# Patient Record
Sex: Female | Born: 1970 | Race: White | Hispanic: No | Marital: Married | Smoking: Never smoker
Health system: Southern US, Community
[De-identification: ages and names within clinical notes are randomized; demographics above are authoritative.]

## PROBLEM LIST (undated history)

## (undated) DIAGNOSIS — Z9889 Other specified postprocedural states: Secondary | ICD-10-CM

## (undated) DIAGNOSIS — T8859XA Other complications of anesthesia, initial encounter: Secondary | ICD-10-CM

## (undated) DIAGNOSIS — T4145XA Adverse effect of unspecified anesthetic, initial encounter: Secondary | ICD-10-CM

## (undated) DIAGNOSIS — A64 Unspecified sexually transmitted disease: Secondary | ICD-10-CM

## (undated) DIAGNOSIS — R590 Localized enlarged lymph nodes: Principal | ICD-10-CM

## (undated) DIAGNOSIS — R112 Nausea with vomiting, unspecified: Secondary | ICD-10-CM

## (undated) DIAGNOSIS — F419 Anxiety disorder, unspecified: Secondary | ICD-10-CM

## (undated) HISTORY — PX: BREAST SURGERY: SHX581

## (undated) HISTORY — DX: Unspecified sexually transmitted disease: A64

## (undated) HISTORY — DX: Localized enlarged lymph nodes: R59.0

---

## 1998-12-30 ENCOUNTER — Other Ambulatory Visit: Admission: RE | Admit: 1998-12-30 | Discharge: 1998-12-30 | Payer: Self-pay

## 1999-05-22 ENCOUNTER — Other Ambulatory Visit: Admission: RE | Admit: 1999-05-22 | Discharge: 1999-05-22 | Payer: Self-pay | Admitting: Obstetrics and Gynecology

## 2000-05-26 ENCOUNTER — Other Ambulatory Visit: Admission: RE | Admit: 2000-05-26 | Discharge: 2000-05-26 | Payer: Self-pay | Admitting: Obstetrics and Gynecology

## 2000-08-23 ENCOUNTER — Encounter: Payer: Self-pay | Admitting: Neurosurgery

## 2000-08-23 ENCOUNTER — Encounter: Admission: RE | Admit: 2000-08-23 | Discharge: 2000-08-23 | Payer: Self-pay | Admitting: Neurosurgery

## 2001-06-22 ENCOUNTER — Other Ambulatory Visit: Admission: RE | Admit: 2001-06-22 | Discharge: 2001-06-22 | Payer: Self-pay | Admitting: Obstetrics and Gynecology

## 2001-10-17 ENCOUNTER — Encounter (INDEPENDENT_AMBULATORY_CARE_PROVIDER_SITE_OTHER): Payer: Self-pay | Admitting: Specialist

## 2001-10-17 ENCOUNTER — Ambulatory Visit (HOSPITAL_COMMUNITY): Admission: RE | Admit: 2001-10-17 | Discharge: 2001-10-17 | Payer: Self-pay | Admitting: Surgery

## 2002-06-29 ENCOUNTER — Other Ambulatory Visit: Admission: RE | Admit: 2002-06-29 | Discharge: 2002-06-29 | Payer: Self-pay | Admitting: Obstetrics and Gynecology

## 2003-07-12 ENCOUNTER — Other Ambulatory Visit: Admission: RE | Admit: 2003-07-12 | Discharge: 2003-07-12 | Payer: Self-pay | Admitting: Obstetrics and Gynecology

## 2004-12-17 ENCOUNTER — Other Ambulatory Visit: Admission: RE | Admit: 2004-12-17 | Discharge: 2004-12-17 | Payer: Self-pay | Admitting: Obstetrics and Gynecology

## 2006-05-13 ENCOUNTER — Other Ambulatory Visit: Admission: RE | Admit: 2006-05-13 | Discharge: 2006-05-13 | Payer: Self-pay | Admitting: Obstetrics and Gynecology

## 2006-06-07 ENCOUNTER — Inpatient Hospital Stay (HOSPITAL_COMMUNITY): Admission: AD | Admit: 2006-06-07 | Discharge: 2006-06-07 | Payer: Self-pay | Admitting: Obstetrics and Gynecology

## 2006-06-11 ENCOUNTER — Inpatient Hospital Stay (HOSPITAL_COMMUNITY): Admission: AD | Admit: 2006-06-11 | Discharge: 2006-06-15 | Payer: Self-pay | Admitting: Obstetrics and Gynecology

## 2006-12-17 ENCOUNTER — Ambulatory Visit (HOSPITAL_COMMUNITY): Admission: RE | Admit: 2006-12-17 | Discharge: 2006-12-17 | Payer: Self-pay | Admitting: Urology

## 2007-06-22 ENCOUNTER — Encounter: Admission: RE | Admit: 2007-06-22 | Discharge: 2007-06-22 | Payer: Self-pay | Admitting: Obstetrics and Gynecology

## 2007-11-21 ENCOUNTER — Encounter: Admission: RE | Admit: 2007-11-21 | Discharge: 2007-11-21 | Payer: Self-pay | Admitting: Obstetrics and Gynecology

## 2008-12-21 ENCOUNTER — Ambulatory Visit: Payer: Self-pay | Admitting: Family Medicine

## 2008-12-24 ENCOUNTER — Ambulatory Visit: Payer: Self-pay | Admitting: Family Medicine

## 2010-10-19 DIAGNOSIS — A64 Unspecified sexually transmitted disease: Secondary | ICD-10-CM

## 2010-10-19 HISTORY — DX: Unspecified sexually transmitted disease: A64

## 2011-03-06 NOTE — H&P (Signed)
Autumn [name], Autumn [name] NO.:  [id number]   MEDICAL RECORD NO.:  [id number]          PATIENT TYPE:  MAT   LOCATION:  MATC                          FACILITY:  [facility]   PHYSICIAN:  Crist Fat. Rivard, M.D. DATE OF BIRTH:  04-16-1971   DATE OF ADMISSION:  06/11/2006  DATE OF DISCHARGE:                                HISTORY & PHYSICAL   HISTORY OF PRESENT ILLNESS:  This is a 40 year old gravida 1, para 0, at 77-  3/7 weeks who presents with leaking fluid since 1730 Hr. She feels a few  mild contractions and positive fetal movement. Fluid is clear and somewhat  yellow tinged. Pregnancy has been followed by the nurse midwife service and  remarkable for:  1. AMA (declined amniocentesis).  2. Patient is a Publishing rights manager.  3. Questionable micrognathia (not confirmed).  4. Group B strep negative.   ALLERGIES:  None.   OBSTETRIC HISTORY:  The patient is a primigravida.   PAST MEDICAL HISTORY:  1. Remarkable for childhood varicella.  2. History of back injury.   PAST SURGICAL HISTORY:  Remarkable for breast lump removed in 1999 which was  benign.   FAMILY HISTORY:  Remarkable for a grandfather and grandmother with MI.  Mother, father and grandfather with hypertension. Mother with varicosities.  Father with tuberculosis. Grandmother with COPD and diabetes. Sister with  lymphoma. Sister with Hodgkin's. Mother with breast cancer. Grandfather with  non Hodgkin's lymphoma, and a grandmother with breast cancer. Sister with  depression.   GENETIC HISTORY:  Remarkable for an uncle with cleft lip. A cousin with  congenital heart disease.   SOCIAL HISTORY:  The patient is married to Daun Peacock who is involved and  supportive. She works as a Publishing rights manager in urology. She is of the  [place] faith. She denies any alcohol, tobacco or drug use.   PRENATAL LABS:  Hemoglobin 12.8, platelets 224,000. Blood type A positive,  antibody screen negative. RPR nonreactive.  Rubella immune. Hepatitis  negative. HIV negative. Pap test normal. Gonorrhea/Chlamydia declined.  Cystic fibrosis negative.   REVIEW OF SYSTEMS:  The patient describes leaking fluid as above. No other  complaints.   OBSTETRIC COURSE:  The patient entered care at [redacted] weeks gestation. She  declined amniocentesis and first trimester screening but did consent to  nuchal transistency screening which was normal. She had an anatomy  ultrasound which was normal except for questionable micrognathia. She was  offered a consultation for perinatology and declined that. She had another  ultrasound at 22 weeks that noted a facial angle of 42 degrees, normal being  greater than 49 degrees.  She did take [employer]. She had a  Glucola at 26 weeks which was normal. She had another ultrasound at 31 weeks  which was normal. She had a negative group B strep at term.   OBJECTIVE DATA:  VITAL SIGNS:  Stable, afebrile.  HEENT:  Within normal limits. Thyroid not enlarged.  CHEST:  Clear to auscultation.  HEART:  Regular rate and rhythm.  ABDOMEN:  Gravid at 39 cm, vertex, Leopold's maneuver shows reactive fetal  heart rate with irregular  contractions every 7-10 minutes.  PELVIC EXAM:  Vagina shows clear to slightly yellow fluid draining copiously  from vagina. Cervix is 1 cm, 60% effaced, -3 station with vertex engaged.  EXTREMITIES:  Within normal limits.   ASSESSMENT:  1. Intrauterine pregnancy at 40-3/7 weeks.  2. Premature rupture of membranes at term.  3. Prodromal contractions.  4. Group B strep negative.   PLAN:  1. Admit to birthing suite. Dr. Estanislado Pandy notified.  2. Options discussed with patient including expectant management versus      augmentation. Patient prefers expectant management with freedom to move      about as possible.      Marie L. Williams, C.N.M.      Crist Fat Rivard, M.D.  Electronically Signed    MLW/MEDQ  D:  06/11/2006  T:  06/11/2006  Job:   [id number]

## 2011-03-06 NOTE — Discharge Summary (Signed)
[name], [name] NO.:  [id number]   MEDICAL RECORD NO.:  [id number]          PATIENT TYPE:  INP   LOCATION:  9111                          [facility]ILITY:  [facility]   PHYSICIAN:  Osborn Coho, M.D.   DATE OF BIRTH:  01-30-1971   DATE OF ADMISSION:  06/11/2006  DATE OF DISCHARGE:  06/15/2006                                 DISCHARGE SUMMARY   ADMITTING DIAGNOSES:  1. Intrauterine pregnancy at 40-3/7th's weeks.  2. Premature rupture of membranes at term.  3. Group B strep negative.   DISCHARGE DIAGNOSES:  1. Intrauterine pregnancy at 40-3/7th's weeks.  2. Non-reassuring fetal heart rate.  3. Infant with cleft palate and micrognathia.   PROCEDURES:  1. Primary low-transverse cesarean section.  2. Epidural anesthesia.   HOSPITAL COURSE:  Autumn [name] is a 40 year old gravida 1, para 0 at 21-  3/7th's weeks who presented late in the evening of June 11, 2006, with  premature rupture of membranes prior to the onset of labor.  Fluid had a  slight yellow tinge at that time.  She was having just a few contractions.   Her pregnancy had been remarkable for:  1. Advanced maternal age with amnio declined.  2. Patient is a Publishing rights manager.  3. Questionable micrognathia or small chin which was seen on early      ultrasound but then could not be re-evaluated.  The patient also      declined any further evaluation.  4. Group B strep negative.   Hemoglobin, on admission, was 12.8, platelets were 224,000.  On admission  she was 1-cm, 60% effaced with a vertex at a -3 station.  Options reviewed  with the patient.  She elected to maintain expected management initially.  She received some Ambien and did sleep well through the night.  She began to  feel more contractions during the course of the day on June 12, 2006.  She  used breast pumps to try and initiate contractions.  This did increase them  to every 4-5 minutes.  Fetal heart rate remained reassuring.  By 1:15 in  the  afternoon on June 12, 2006, she was 3, 80% vertex, at a -2 to -3 station.  Blood pressure was slightly elevated at 143/91.  Other vital signs were  stable.  PIH labs were done.  She did receive a walking epidural but that  made her very nauseated.  __________ waters were noted.  These were ruptured  at 3:30 in the afternoon with moderate meconium noted.  A continuous  epidural was initiated by 5 p.m.  Contractions were every 5-8 minutes.  The  decision was made to proceed with  Pitocin augmentation.  Scopolamine patch  was eventually used with some benefit.  Antibiotics were deferred per  consult with Dr. Estanislado Pandy, unless the patient presented with a temp.   By 7:15, she had a series of variable decelerations, then a subsequent  prolonged decel.  Scalp lead and intrauterine pressure catheter were placed.  The cervix was 4, 80%, vertex was still at a -2 station and the fetus was  slightly asynclitic.  Pitocin was turned off.  It had been on a level of to  4 milliunits.  Fetal heart rate responded to the usual measures.  Short-term  variability was maintained throughout.  Pitocin was started back at  approximately 7:30, it had to be discontinued again at 8:04 p.m. for another  prolonged deceleration.  Again, variability was maintained throughout,  however, there had been rapid progress to 8-to-9-cm.  There was more cervix  on the right than the left.  __________ maintained themselves at less than  170 after Pitocin was discontinued.  Pitocin was restarted at 8:50 at 2  milliunits/min.  There was another decel subsequent to that and a C-section  was recommended at that time.  The cervix was still 8-cm, 80% with a vertex,  at a -1 station.  Dr. Estanislado Pandy was notified and a cesarean was performed by  Dr. Estanislado Pandy under existing epidural anesthesia.  Findings were a viable female  by the name of [name], born at 10:29.  Apgar's were 9 and 9.  Weight was 6  pounds 9 ounces.  There was micrognathia  noted and a cleft palate was also  noted.  The infant was taken to the full term nursery.  Mother was taken to  recovery in good condition.  Breast feeding and pumping was initiated the  following day.  Hemoglobin on day 1 was 10.0, hematocrit 28.7, white blood  cell count 14.9, and platelet count was 175.  The rest of her postoperative  care was within normal limits.  Infant did do a good job latching on with  using breast shields.  The patient was planning condoms for birth control.   By post-op day 3, vital signs were stable.  The patient was afebrile.  She  was tolerating a regular diet.  She was up ad lib and the infant was able to  be discharged home with her.  Incision was clean, dry and intact with Steri-  Strips and subcuticular stitches noted.   The patient was deemed to have received full benefit of hospital stay and  was discharged home.   DISCHARGE INSTRUCTIONS:  Per [employer].   DISCHARGE MEDICATIONS:  1. Motrin 600 mg p.o. q.6 h. p.r.n. pain.  2. Tylox 1-2 p.o. every three to four hours p.r.n. pain.   DISCHARGE FOLLOWUP:  Will occur in 3 weeks at [facility] with me  secondary to an increased risk of postpartum depression and a special needs  infant.      Renaldo Reel Emilee Hero, C.N.M.      Osborn Coho, M.D.  Electronically Signed    VLL/MEDQ  D:  06/15/2006  T:  06/15/2006  Job:  [id number]

## 2011-03-06 NOTE — Op Note (Signed)
NAMESCARLETT, PORTLOCK NO.:  [id number]   MEDICAL RECORD NO.:  [id number]          PATIENT TYPE:  INP   LOCATION:  9111                          [facility]ILITY:  [facility]   PHYSICIAN:  Crist Fat. Rivard, M.D. DATE OF BIRTH:  [birth year]   DATE OF PROCEDURE:  06/12/2006  DATE OF DISCHARGE:                                 OPERATIVE REPORT   PREOPERATIVE DIAGNOSIS:  Intrauterine pregnancy at 40 weeks and 3 days, with  nonreassuring fetal heart rate.   POSTOPERATIVE DIAGNOSIS:  Intrauterine pregnancy at 40 weeks and 3 days,  with nonreassuring fetal heart rate.   PROCEDURE:  Primary low transverse cesarean section.   SURGEON:  Crist Fat. Rivard, M.D.   ASSISTANTRenaldo Reel. Latham, C.N.M.   ESTIMATED BLOOD LOSS:  700 cc.   PROCEDURE:  After being informed of the planned procedure, with possible  complications including bleeding, infection, injury to bowel, bladder, or  ureters, informed consent is obtained. The patient is taken to O.R. 2, and  preexisting epidural anesthesia is reinforced.  The patient is placed in the  dorsal decubitus position, pelvis tilted to the left, prepped and draped in  a sterile fashion, and a Foley catheter was already in her bladder. Prior to  prepping the patient, fetal heart rate had returned to normal, with a  baseline of 130-140 and no deceleration.  Scalp lead is then removed, and  the patient is draped.   After assessing adequate level of anesthesia, we infiltrate the suprapubic  area with 20 cc of Marcaine 0.25 and perform a Pfannenstiel incision which  is brought down sharply to the fascia.  Fascia is then incised in a low  transverse fashion.  Linea alba is dissected.  Peritoneum is entered  sharply.  Visceral peritoneum is entered in a low transverse fashion,  allowing [place] to safely retract bladder by developing a bladder flap.  Myometrium is entered in a low transverse fashion, first with knife then  extended bluntly.  Amniotic fluid  is meconium stained. We assist the birth  of a female infant in left OA presentation.  Mouth and nose are suctioned with  DeLee suction body was delivered.  Cord is clamped with two Kelly clamps and  sectioned, and the baby is given to Dr. Francine Graven, neonatologist present in  the room.  8 cc of blood is drawn from the umbilical vein, and the cord is  clamped for cord blood donation. The placenta is allowed to deliver  spontaneously.  It is complete, cord has three vessels, and the uterine  revision is negative.  Ancef 2 g IV is given to the patient, and Pitocin  perfusion is started.   Myometrium is closed in two layers, first with a running lock suture of 0  Vicryl, then with a Lembert suture of zero Vicryl imbricating the first one.  Hemostasis is checked and adequate.  Both paracolic gutters are cleaned,  pelvis was irrigated profusely with warm saline, and hemostasis is rechecked  and adequate.  Both tubes and ovaries are assessed and normal.   Under fascia, hemostasis is completed with cautery, and the fascia is  closed  with two running sutures of 1 Vicryl, meeting midline.  The wound is then  irrigated with warm saline.  Hemostasis is completed with cautery, and the  skin is closed with a subcuticular suture of 3-0 Monocryl and Steri-Strips.   Instruments and sponge count is complete x2.  Estimated blood loss is 700  cc. The procedure is very well tolerated by the patient, who is taken to the  recovery room in a well and stable condition.   Little boy named [name] was born at 10:29 p.m., received an Apgar of 9 at one  minute and 9 and five minutes, and weight 6 pounds 9 ounces.  With initial  evaluation, [name] was found to have a cleft palate with intact lip, as well  as micrognathia.      Crist Fat Rivard, M.D.  Electronically Signed     SAR/MEDQ  D:  06/12/2006  T:  06/14/2006  Job:  [id number]

## 2011-03-06 NOTE — Op Note (Signed)
[facility]  Patient:    Autumn [name], Autumn [name] Visit Number: [id number] MRN: [id number]          Service Type: DSU Location: DAY Attending Physician:  Katha Cabal Proc. Date: 10/17/01 Admit Date:  10/17/2001   CC:         Jeralyn Ruths, M.D.                           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  It has been more palpable to her and has been worrying her, and desired it to be removed.  PROCEDURE PERFORMED:  Right breast biopsy.  SURGEON:  Thornton Park. Daphine Deutscher, M.D.  ANESTHESIA:  MAC.  DESCRIPTION OF PROCEDURE:  The patient was taken to room #1 at [facility] and the area in question had been localized by her and marked.  The breast was prepped with Betadine and draped sterilely.  Skin lines were marked and an appropriate incision was planned with a perpendicular line to subsequently line up the incision were made.  The area was infiltrated with lidocaine and an incision was made and carried down through the dermis into the fat.  I carried this down through the fat, could palpate the mass, and then put a suture through it, and used that to pull it up.  Sharp dissection using the Metzenbaums was done to get around the mass and excise it in toto.  Minimal bleeding was encountered and this was controlled with the electrocautery. When out, the specimen was opened on the table, and I could see the white well defined nodule consistent with a fibroadenoma.  This was confirmed with anatomic identification by Dr. Laureen Ochs and sent for permanent sections.  In the meantime, the wound was irrigated with saline and with residual lidocaine and Sensorcaine mixture.  The deep breast tissue was approximated with 4-0 Vicryl.  The subcutaneous tissue was approximated with 4-0 Vicryl and the skin  wound was closed with a running subcuticular 5-0 Vicryl with Benzoin and Steri-Strips.  The patient tolerated the procedure well.  She was taken to the recovery room in satisfactory condition.  She was given Vicodin to take for pain and should be followed in the office in approximately three weeks. Attending Physician:  Katha Cabal DD:  10/17/01 TD:  10/17/01 Job: [id number]

## 2011-04-16 ENCOUNTER — Other Ambulatory Visit: Payer: Self-pay | Admitting: Obstetrics and Gynecology

## 2011-04-16 DIAGNOSIS — Z1231 Encounter for screening mammogram for malignant neoplasm of breast: Secondary | ICD-10-CM

## 2011-04-28 ENCOUNTER — Ambulatory Visit: Payer: Self-pay

## 2011-05-20 ENCOUNTER — Ambulatory Visit
Admission: RE | Admit: 2011-05-20 | Discharge: 2011-05-20 | Disposition: A | Discharge status: Home / Self Care | Payer: 59 | Source: Ambulatory Visit | Attending: Obstetrics and Gynecology | Admitting: Obstetrics and Gynecology

## 2011-05-20 DIAGNOSIS — Z1231 Encounter for screening mammogram for malignant neoplasm of breast: Secondary | ICD-10-CM

## 2011-11-02 ENCOUNTER — Other Ambulatory Visit: Payer: Self-pay | Admitting: Otolaryngology

## 2011-11-10 ENCOUNTER — Ambulatory Visit
Admission: RE | Admit: 2011-11-10 | Discharge: 2011-11-10 | Disposition: A | Discharge status: Home / Self Care | Payer: 59 | Source: Ambulatory Visit | Attending: Otolaryngology | Admitting: Otolaryngology

## 2011-11-10 MED ORDER — GADOBENATE DIMEGLUMINE 529 MG/ML IV SOLN
13.0000 mL | Freq: Once | INTRAVENOUS | Status: AC | PRN
  Administered 2011-11-10: 13 mL via INTRAVENOUS

## 2012-07-05 ENCOUNTER — Ambulatory Visit (INDEPENDENT_AMBULATORY_CARE_PROVIDER_SITE_OTHER): Payer: 59 | Admitting: Obstetrics and Gynecology

## 2012-07-05 ENCOUNTER — Encounter: Payer: Self-pay | Admitting: Obstetrics and Gynecology

## 2012-07-05 VITALS — Wt 143.0 lb

## 2012-07-05 DIAGNOSIS — N6019 Diffuse cystic mastopathy of unspecified breast: Secondary | ICD-10-CM

## 2012-07-05 DIAGNOSIS — N943 Premenstrual tension syndrome: Secondary | ICD-10-CM

## 2012-07-05 DIAGNOSIS — D249 Benign neoplasm of unspecified breast: Secondary | ICD-10-CM

## 2012-07-05 DIAGNOSIS — Z124 Encounter for screening for malignant neoplasm of cervix: Secondary | ICD-10-CM

## 2012-07-05 DIAGNOSIS — D241 Benign neoplasm of right breast: Secondary | ICD-10-CM | POA: Insufficient documentation

## 2012-07-05 DIAGNOSIS — C8190 Hodgkin lymphoma, unspecified, unspecified site: Secondary | ICD-10-CM

## 2012-07-05 DIAGNOSIS — Z0000 Encounter for general adult medical examination without abnormal findings: Secondary | ICD-10-CM

## 2012-07-05 DIAGNOSIS — F3281 Premenstrual dysphoric disorder: Secondary | ICD-10-CM | POA: Insufficient documentation

## 2012-07-05 NOTE — Progress Notes (Signed)
Regular Periods: yes Mammogram: no  Monthly Breast Ex.: no Exercise: yes  Tetanus < 10 years: no  Seatbelts: yes  NI. Bladder Functn.: yes Abuse at home: no  Daily BM's: yes Stressful Work: no  Healthy Diet: yes Sigmoid-Colonoscopy: 2008 WNL  Calcium: no Medical problems this year: R breast lump   LAST PAP:05/19/2011  Contraception: none  Mammogram:  1 year ago TIME!  PCP: [facility] Autumn [name]   PMH: see hx  FMH: see hx  Last Bone Scan: none  No other issues per pt.

## 2012-07-05 NOTE — Progress Notes (Signed)
Subjective:    Autumn [name] is a 41 y.o. female, G2P0011, who presents for an annual exam.   Patient reports:  Severe mood swings just before cycle onset, lasting just isolated time on one day, but severe.  Had spoken with me informally about this, then saw a counselor.  Started Zoloft, currently only taking 12.5 q day just prior to cycle onset, due to SE on 50 and 25 mg dosing.  Feels 12.5 mg gives benefit without SE.  Notes increased fibrous changes in breast. 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 One biological son, one adopted son.    History   Social History  . Marital Status: Married    Spouse Name: N/A    Number of Children: N/A  . Years of Education: N/A   Social History Main Topics  . Smoking status: Never Smoker   . Smokeless tobacco: Never Used  . Alcohol Use: No  . Drug Use: No  . Sexually Active: Yes -- Female partner(s)    Birth Control/ Protection: Condom   Other Topics Concern  . None   Social History Narrative  . None    Menstrual cycle:   LMP: Patient's last menstrual period was 06/26/2012.           Cycle: WNL  The following portions of the patient's history were reviewed and updated as appropriate: allergies, current medications, past family history, past medical history, past social history, past surgical history and problem list.  Review of Systems Pertinent items are noted in HPI. Breast:Negative for breast lump,nipple discharge or nipple retraction Gastrointestinal: Negative for abdominal pain, change in bowel habits or rectal bleeding Urinary:negative   Objective:    Wt 143 lb (64.864 kg)  LMP 06/26/2012    Weight:  Wt Readings from Last 1 Encounters:  07/05/12 143 lb (64.864 kg)          BMI: There is no height on file to calculate BMI.  General Appearance: Alert, appropriate appearance for age. No acute distress HEENT: Grossly normal Neck / Thyroid: Supple, no masses, nodes or enlargement Lungs: clear to auscultation  bilaterally Back: No CVA tenderness Breast Exam: Breasts are bilaterally fibrous, with no obvious dominant masses or lumps. Cardiovascular: Regular rate and rhythm. S1, S2, no murmur Gastrointestinal: Soft, non-tender, no masses or organomegaly Pelvic Exam: Vulva and vagina appear normal. Bimanual exam reveals normal uterus and adnexa. Rectovaginal: normal rectal, no masses Lymphatic Exam: Non-palpable nodes in neck, clavicular, axillary, or inguinal regions Skin: no rash or abnormalities Neurologic: Normal gait and speech, no tremor  Psychiatric: Alert and oriented, appropriate affect.   Wet Prep:not applicable Urinalysis:not applicable UPT: Not done   Assessment:    Normal gyn exam  PMDD vs. Severe PMS   Plan:    Mammogram: Due--patient will self[facility] with [facility] Pap:  Done STD screening: declined Contraception:condoms Other:  Continue f/u with counselor and PA for Zoloft med management. TSH, CBC, CMP, Vit D, fasting lipid profile next week as lab only appointment--dx fatigue and family hx hypercholesterinemia.      Nyra Capes, [place]

## 2012-07-12 ENCOUNTER — Other Ambulatory Visit: Payer: 59

## 2012-07-12 DIAGNOSIS — Z0000 Encounter for general adult medical examination without abnormal findings: Secondary | ICD-10-CM

## 2012-07-12 DIAGNOSIS — R5381 Other malaise: Secondary | ICD-10-CM

## 2012-07-12 DIAGNOSIS — R5383 Other fatigue: Secondary | ICD-10-CM

## 2012-07-12 LAB — CBC
MCH: 30 pg (ref 26.0–34.0)
MCHC: 34.7 g/dL (ref 30.0–36.0)
Platelets: 189 10*3/uL (ref 150–400)
RBC: 4.16 MIL/uL (ref 3.87–5.11)

## 2012-07-12 LAB — COMPREHENSIVE METABOLIC PANEL
ALT: 11 U/L (ref 0–35)
Albumin: 4.6 g/dL (ref 3.5–5.2)
CO2: 27 mEq/L (ref 19–32)
Calcium: 9.1 mg/dL (ref 8.4–10.5)
Chloride: 105 mEq/L (ref 96–112)
Sodium: 139 mEq/L (ref 135–145)
Total Protein: 6.5 g/dL (ref 6.0–8.3)

## 2012-07-12 LAB — TSH: TSH: 2.038 u[IU]/mL (ref 0.350–4.500)

## 2012-07-12 LAB — LIPID PANEL: Cholesterol: 172 mg/dL (ref 0–200)

## 2012-07-13 LAB — VITAMIN D 25 HYDROXY (VIT D DEFICIENCY, FRACTURES): Vit D, 25-Hydroxy: 33 ng/mL (ref 30–89)

## 2012-07-14 ENCOUNTER — Telehealth: Payer: Self-pay

## 2012-07-14 NOTE — Telephone Encounter (Signed)
LM ON VM INFORMING PT OF TEST RESULTS. LET PT KNOW BY VM THAT HER TEST RESULT WERE ALL WNL EXCEPT FOR HER SLIGHTLY ELEVATED LDL WHICH SHE CAN ADJUST HER DIET TO GET THAT TO NORMAL LIMITS. INFORMED PT THAT IF SHE HAS ANY QUESTIONS TO GIVE [place] A CALL BACK.

## 2012-07-15 ENCOUNTER — Other Ambulatory Visit: Payer: Self-pay | Admitting: Obstetrics and Gynecology

## 2012-07-15 ENCOUNTER — Telehealth: Payer: Self-pay | Admitting: Obstetrics and Gynecology

## 2012-07-15 DIAGNOSIS — N6019 Diffuse cystic mastopathy of unspecified breast: Secondary | ICD-10-CM

## 2012-07-15 NOTE — Telephone Encounter (Signed)
Tc to pt regarding msg.  Per VL, may put in order for diagnostic bilateral mammogram to the [facility].  Pt also says saw in her chart something positive for an STD, informed pt did not see anything in the system or in her chart, pt voices agreement to all.

## 2012-08-04 ENCOUNTER — Ambulatory Visit
Admission: RE | Admit: 2012-08-04 | Discharge: 2012-08-04 | Disposition: A | Discharge status: Home / Self Care | Payer: 59 | Source: Ambulatory Visit | Attending: Obstetrics and Gynecology | Admitting: Obstetrics and Gynecology

## 2012-08-04 ENCOUNTER — Other Ambulatory Visit: Payer: Self-pay | Admitting: Obstetrics and Gynecology

## 2012-08-04 ENCOUNTER — Encounter: Payer: Self-pay | Admitting: Obstetrics and Gynecology

## 2012-08-04 DIAGNOSIS — N6019 Diffuse cystic mastopathy of unspecified breast: Secondary | ICD-10-CM

## 2012-10-07 ENCOUNTER — Ambulatory Visit (INDEPENDENT_AMBULATORY_CARE_PROVIDER_SITE_OTHER): Payer: 59 | Admitting: Emergency Medicine

## 2012-10-07 VITALS — BP 91/65 | HR 106 | Temp 98.4°F | Resp 17 | Ht 66.0 in | Wt 145.0 lb

## 2012-10-07 DIAGNOSIS — R509 Fever, unspecified: Secondary | ICD-10-CM

## 2012-10-07 DIAGNOSIS — J111 Influenza due to unidentified influenza virus with other respiratory manifestations: Secondary | ICD-10-CM

## 2012-10-07 LAB — POCT INFLUENZA A/B: Influenza B, POC: NEGATIVE

## 2012-10-07 MED ORDER — HYDROCOD POLST-CHLORPHEN POLST 10-8 MG/5ML PO LQCR: 5.0000 mL | Freq: Two times a day (BID) | ORAL | Status: DC | PRN

## 2012-10-07 MED ORDER — PSEUDOEPHEDRINE-GUAIFENESIN ER 60-600 MG PO TB12: 1.0000 | ORAL_TABLET | Freq: Two times a day (BID) | ORAL | Status: AC

## 2012-10-07 NOTE — Progress Notes (Signed)
[facility] [address] [place] [address] [phone]  Date:  10/07/2012   Name:  Autumn [name]   DOB:  [birth year]   MRN:  [id number]  PCP:  Claude Manges, FNP    Chief Complaint: Cough, Generalized Body Aches and Fever   History of Present Illness:  Autumn [name] is a 41 y.o. very pleasant female patient who presents with the following:  Ill since Tuesday night with fever to 102 and cough.  Has malaise, myalgia, chills.  Cough not productive with no wheezing or shortness of breath.  Today developed a clear nasal drainage. No headache, nausea or vomiting.  No improvement with OTC meds.  Had a flu shot.  Works as a Engineer, civil (consulting).  Patient Active Problem List  Diagnosis  . Fibrocystic breast changes  . PMDD (premenstrual dysphoric disorder)  . Hx of fibroadenoma of right breast  . Family hx of Hodgkin's disease    Past Medical History  Diagnosis Date  . Venereal disease 2012    ringing in L ear     Past Surgical History  Procedure Date  . Cesarean section 2007  . Breast surgery 1999 or 2000    R breast fibercystic    History  Substance Use Topics  . Smoking status: Never Smoker   . Smokeless tobacco: Never Used  . Alcohol Use: No    Family History  Problem Relation Age of Onset  . Hypertension Mother   . Hypertension Father   . Heart disease Father   . Hodgkin's lymphoma Sister   . Heart disease Sister   . Diabetes Maternal Grandmother   . Cancer Maternal Grandmother     breast  . Cancer Maternal Grandfather     lymphoma  . Cancer Paternal Grandmother     breast  . Hypertension Paternal Grandfather   . Heart disease Paternal Grandfather     No Known Allergies  Medication list has been reviewed and updated.  Current Outpatient Prescriptions on File Prior to Visit  Medication Sig Dispense Refill  . sertraline (ZOLOFT) 50 MG tablet Take 50 mg by mouth daily.        Review of Systems:  As per HPI, otherwise  negative.    Physical Examination: Filed Vitals:   10/07/12 1157  BP: 91/65  Pulse: 106  Temp: 98.4 F (36.9 C)  Resp: 17   Filed Vitals:   10/07/12 1157  Height: 5\' 6"  (1.676 m)  Weight: 145 lb (65.772 kg)   Body mass index is 23.40 kg/(m^2). Ideal Body Weight: Weight in (lb) to have BMI = 25: 154.6   GEN: WDWN, NAD, Non-toxic, A & O x 3 HEENT: Atraumatic, Normocephalic. Neck supple. No masses, No LAD. Ears and Nose: No external deformity. CV: RRR, No M/G/R. No JVD. No thrill. No extra heart sounds. PULM: CTA B, no wheezes, crackles, rhonchi. No retractions. No resp. distress. No accessory muscle use. ABD: S, NT, ND, +BS. No rebound. No HSM. EXTR: No c/c/e NEURO Normal gait.  PSYCH: Normally interactive. Conversant. Not depressed or anxious appearing.  Calm demeanor.    Assessment and Plan: Influenza Outside window for tamiflu mucinex d tussionex  Carmelina Dane, MD  Results for orders placed in visit on 10/07/12  POCT INFLUENZA A/B      Component Value Range   Influenza A, POC Positive     Influenza B, POC

## 2012-10-14 ENCOUNTER — Other Ambulatory Visit: Payer: Self-pay | Admitting: Family Medicine

## 2012-10-14 ENCOUNTER — Ambulatory Visit
Admission: RE | Admit: 2012-10-14 | Discharge: 2012-10-14 | Disposition: A | Discharge status: Home / Self Care | Payer: 59 | Source: Ambulatory Visit | Attending: Family Medicine | Admitting: Family Medicine

## 2012-10-14 DIAGNOSIS — R05 Cough: Secondary | ICD-10-CM

## 2012-10-14 NOTE — Progress Notes (Signed)
Reviewed and agree.

## 2012-12-03 ENCOUNTER — Other Ambulatory Visit: Payer: Self-pay

## 2013-08-24 ENCOUNTER — Other Ambulatory Visit: Payer: Self-pay

## 2013-09-19 ENCOUNTER — Other Ambulatory Visit: Payer: Self-pay

## 2013-09-19 DIAGNOSIS — Z1231 Encounter for screening mammogram for malignant neoplasm of breast: Secondary | ICD-10-CM

## 2013-09-20 ENCOUNTER — Ambulatory Visit
Admission: RE | Admit: 2013-09-20 | Discharge: 2013-09-20 | Disposition: A | Discharge status: Home / Self Care | Payer: 59 | Source: Ambulatory Visit

## 2013-09-20 DIAGNOSIS — Z1231 Encounter for screening mammogram for malignant neoplasm of breast: Secondary | ICD-10-CM

## 2015-02-04 ENCOUNTER — Other Ambulatory Visit: Payer: Self-pay

## 2015-02-04 DIAGNOSIS — Z1231 Encounter for screening mammogram for malignant neoplasm of breast: Secondary | ICD-10-CM

## 2015-03-01 ENCOUNTER — Ambulatory Visit: Payer: Self-pay

## 2015-04-19 ENCOUNTER — Ambulatory Visit: Payer: Self-pay

## 2015-04-19 ENCOUNTER — Telehealth: Payer: Self-pay | Admitting: Hematology and Oncology

## 2015-04-19 NOTE — Telephone Encounter (Signed)
new patient appt-s/w patient and gave np appt for 07/15 @ 1:30 w/Dr. Alvy Bimler

## 2015-05-03 ENCOUNTER — Ambulatory Visit: Payer: 59

## 2015-05-03 ENCOUNTER — Encounter: Payer: Self-pay | Admitting: Hematology and Oncology

## 2015-05-03 ENCOUNTER — Telehealth: Payer: Self-pay | Admitting: Hematology and Oncology

## 2015-05-03 ENCOUNTER — Ambulatory Visit (HOSPITAL_BASED_OUTPATIENT_CLINIC_OR_DEPARTMENT_OTHER): Payer: 59

## 2015-05-03 ENCOUNTER — Ambulatory Visit (HOSPITAL_BASED_OUTPATIENT_CLINIC_OR_DEPARTMENT_OTHER): Payer: 59 | Admitting: Hematology and Oncology

## 2015-05-03 VITALS — BP 130/75 | HR 69 | Temp 98.2°F | Resp 18 | Ht 66.0 in | Wt 150.8 lb

## 2015-05-03 DIAGNOSIS — R599 Enlarged lymph nodes, unspecified: Secondary | ICD-10-CM | POA: Diagnosis not present

## 2015-05-03 DIAGNOSIS — R590 Localized enlarged lymph nodes: Secondary | ICD-10-CM

## 2015-05-03 HISTORY — DX: Localized enlarged lymph nodes: R59.0

## 2015-05-03 LAB — CBC WITH DIFFERENTIAL/PLATELET
BASO%: 0.2 % (ref 0.0–2.0)
BASOS ABS: 0 10*3/uL (ref 0.0–0.1)
EOS ABS: 0.1 10*3/uL (ref 0.0–0.5)
EOS%: 1.1 % (ref 0.0–7.0)
HEMATOCRIT: 35.3 % (ref 34.8–46.6)
HGB: 12.2 g/dL (ref 11.6–15.9)
LYMPH%: 23.4 % (ref 14.0–49.7)
MCH: 29.5 pg (ref 25.1–34.0)
MCHC: 34.6 g/dL (ref 31.5–36.0)
MCV: 85.5 fL (ref 79.5–101.0)
MONO#: 0.5 10*3/uL (ref 0.1–0.9)
MONO%: 9.9 % (ref 0.0–14.0)
NEUT%: 65.4 % (ref 38.4–76.8)
NEUTROS ABS: 3.6 10*3/uL (ref 1.5–6.5)
Platelets: 156 10*3/uL (ref 145–400)
RBC: 4.13 10*6/uL (ref 3.70–5.45)
RDW: 12.9 % (ref 11.2–14.5)
WBC: 5.4 10*3/uL (ref 3.9–10.3)
lymph#: 1.3 10*3/uL (ref 0.9–3.3)

## 2015-05-03 LAB — LACTATE DEHYDROGENASE (CC13): LDH: 164 U/L (ref 125–245)

## 2015-05-03 LAB — MORPHOLOGY
PLATELET MORPHOLOGY: NORMAL
PLT EST: ADEQUATE
RBC Comments: NORMAL

## 2015-05-03 NOTE — Assessment & Plan Note (Signed)
She has strong family history of lymphoma and understandably is concerned she may also have lymphoma. On clinical exam, she had palpable bilateral lymphadenopathy which are very small. They could also be reactive in nature. I will order blood work to exclude infection, autoimmune disease and CT scan for further assessment. If blood work is unrevealing, she may benefit from excisional lymph node biopsy to exclude lymphoma.

## 2015-05-03 NOTE — Telephone Encounter (Signed)
Pt confirmed labs/ov per 07/15 POF, gave pt AVS and Calendar.Cherylann Banas, sent msg to add chemo NP.Marland KitchenMarland KitchenMarland Kitchen

## 2015-05-03 NOTE — Progress Notes (Signed)
Checked in new pt with no financial concerns prior to seeing the dr.  Pt has my card for any billing questions, concerns or if financial assistance is needed.  ° °

## 2015-05-03 NOTE — Progress Notes (Signed)
[facility] NOTE  Patient Care Team: Helane Rima, MD as PCP - General (Family Medicine) Thornell Sartorius, MD as Consulting Physician (Otolaryngology) Vicie Mutters, MD as Consulting Physician (Otolaryngology)  CHIEF COMPLAINTS/PURPOSE OF CONSULTATION:  Bilateral lymphadenopathy, concerning for lymphoma  HISTORY OF PRESENTING ILLNESS:  Autumn [name] 44 y.o. female is here because of bilateral palpable lymphadenopathy in the neck region. This patient has strong family history of lymphoma in her sister. Over the past month or so, she self palpated enlarging lymphadenopathy in the left side of the neck. She denies any tenderness. Denies any recent infection in the head and neck region. She thought she might have felt a lymph node in the right groin as well. She denies any night sweats, loss of appetite or weight.   MEDICAL HISTORY:  Past Medical History  Diagnosis Date  . Venereal disease 2012    ringing in L ear   . Lymphadenopathy of head and neck region 05/03/2015    SURGICAL HISTORY: Past Surgical History  Procedure Laterality Date  . Cesarean section  2007  . Breast surgery  1999 or 2000    R breast fibercystic    SOCIAL HISTORY: History   Social History  . Marital Status: Married    Spouse Name: N/A  . Number of Children: N/A  . Years of Education: N/A   Occupational History  . Not on file.   Social History Main Topics  . Smoking status: Never Smoker   . Smokeless tobacco: Never Used  . Alcohol Use: No  . Drug Use: No  . Sexual Activity:    Partners: Male    Birth Control/ Protection: Condom   Other Topics Concern  . Not on file   Social History Narrative    FAMILY HISTORY: Family History  Problem Relation Age of Onset  . Hypertension Mother   . Cancer Mother 85    uterine ca  . Hypertension Father   . Heart disease Father   . Hodgkin's lymphoma Sister   . Heart disease Sister   . Diabetes Maternal Grandmother   .  Cancer Maternal Grandmother     breast  . Cancer Maternal Grandfather     lymphoma  . Cancer Paternal Grandmother     breast  . Hypertension Paternal Grandfather   . Heart disease Paternal Grandfather     ALLERGIES:  is allergic to codeine.  MEDICATIONS:  Current Outpatient Prescriptions  Medication Sig Dispense Refill  . acetaminophen (TYLENOL) 325 MG tablet Take 650 mg by mouth every 6 (six) hours as needed.    Marland Kitchen ibuprofen (ADVIL,MOTRIN) 200 MG tablet Take 200 mg by mouth every 6 (six) hours as needed.     No current facility-administered medications for this visit.    REVIEW OF SYSTEMS:   Constitutional: Denies fevers, chills or abnormal night sweats Eyes: Denies blurriness of vision, double vision or watery eyes Ears, nose, mouth, throat, and face: Denies mucositis or sore throat Respiratory: Denies cough, dyspnea or wheezes Cardiovascular: Denies palpitation, chest discomfort or lower extremity swelling Gastrointestinal:  Denies nausea, heartburn or change in bowel habits Skin: Denies abnormal skin rashes Neurological:Denies numbness, tingling or new weaknesses Behavioral/Psych: Mood is stable, no new changes  All other systems were reviewed with the patient and are negative.  PHYSICAL EXAMINATION: ECOG PERFORMANCE STATUS: 0 - Asymptomatic  Filed Vitals:   05/03/15 1345  BP: 130/75  Pulse: 69  Temp: 98.2 F (36.8 C)  Resp: 18   Filed Weights  05/03/15 1345  Weight: 150 lb 12.8 oz (68.402 kg)    GENERAL:alert, no distress and comfortable SKIN: skin color, texture, turgor are normal, no rashes or significant lesions EYES: normal, conjunctiva are pink and non-injected, sclera clear OROPHARYNX:no exudate, no erythema and lips, buccal mucosa, and tongue normal  NECK: supple, thyroid normal size, non-tender, without nodularity LYMPH:  Appreciated lymphadenopathy on both sides of the neck, more prominent in the level III lymph node region on the left side of the  neck. She also had palpable supraclavicular lymph node and a small prominent lymph node in the right suprapubic region near the inguinal area. LUNGS: clear to auscultation and percussion with normal breathing effort HEART: regular rate & rhythm and no murmurs and no lower extremity edema ABDOMEN:abdomen soft, non-tender and normal bowel sounds Musculoskeletal:no cyanosis of digits and no clubbing  PSYCH: alert & oriented x 3 with fluent speech NEURO: no focal motor/sensory deficits  LABORATORY DATA:  I have reviewed the data as listed Lab Results  Component Value Date   WBC 5.4 05/03/2015   HGB 12.2 05/03/2015   HCT 35.3 05/03/2015   MCV 85.5 05/03/2015   PLT 156 05/03/2015   No results for input(s): NA, K, CL, CO2, GLUCOSE, BUN, CREATININE, CALCIUM, GFRNONAA, GFRAA, PROT, ALBUMIN, AST, ALT, ALKPHOS, BILITOT, BILIDIR, IBILI in the last 8760 hours.  ASSESSMENT & PLAN:  Lymphadenopathy of head and neck region She has strong family history of lymphoma and understandably is concerned she may also have lymphoma. On clinical exam, she had palpable bilateral lymphadenopathy which are very small. They could also be reactive in nature. I will order blood work to exclude infection, autoimmune disease and CT scan for further assessment. If blood work is unrevealing, she may benefit from excisional lymph node biopsy to exclude lymphoma.     All questions were answered. The patient knows to call the clinic with any problems, questions or concerns. I spent 40 minutes counseling the patient face to face. The total time spent in the appointment was 55 minutes and more than 50% was on counseling.     [facility], Rylynn Schoneman, MD 05/03/2015 5:07 PM

## 2015-05-04 LAB — HIV ANTIBODY (ROUTINE TESTING W REFLEX): HIV 1&2 Ab, 4th Generation: NONREACTIVE

## 2015-05-04 LAB — PREGNANCY, URINE: PREG TEST UR: NEGATIVE

## 2015-05-04 LAB — SEDIMENTATION RATE: SED RATE: 4 mm/h (ref 0–20)

## 2015-05-06 LAB — ANA: ANA: NEGATIVE

## 2015-05-09 ENCOUNTER — Ambulatory Visit (HOSPITAL_COMMUNITY)
Admission: RE | Admit: 2015-05-09 | Discharge: 2015-05-09 | Disposition: A | Discharge status: Home / Self Care | Payer: 59 | Source: Ambulatory Visit | Attending: Hematology and Oncology | Admitting: Hematology and Oncology

## 2015-05-09 ENCOUNTER — Encounter (HOSPITAL_COMMUNITY): Payer: Self-pay

## 2015-05-09 DIAGNOSIS — R590 Localized enlarged lymph nodes: Secondary | ICD-10-CM | POA: Insufficient documentation

## 2015-05-09 MED ORDER — IOHEXOL 300 MG/ML  SOLN
100.0000 mL | Freq: Once | INTRAMUSCULAR | Status: AC | PRN
Start: 2015-05-09 — End: 2015-05-09
  Administered 2015-05-09: 80 mL via INTRAVENOUS

## 2015-05-17 ENCOUNTER — Ambulatory Visit (HOSPITAL_BASED_OUTPATIENT_CLINIC_OR_DEPARTMENT_OTHER): Payer: 59 | Admitting: Hematology and Oncology

## 2015-05-17 ENCOUNTER — Telehealth: Payer: Self-pay | Admitting: Hematology and Oncology

## 2015-05-17 ENCOUNTER — Telehealth: Payer: Self-pay

## 2015-05-17 ENCOUNTER — Encounter: Payer: Self-pay | Admitting: Hematology and Oncology

## 2015-05-17 VITALS — BP 124/76 | HR 84 | Temp 98.0°F | Resp 20 | Ht 66.0 in | Wt 152.5 lb

## 2015-05-17 DIAGNOSIS — R599 Enlarged lymph nodes, unspecified: Secondary | ICD-10-CM | POA: Diagnosis not present

## 2015-05-17 DIAGNOSIS — R590 Localized enlarged lymph nodes: Secondary | ICD-10-CM

## 2015-05-17 NOTE — Progress Notes (Signed)
[place] OFFICE PROGRESS NOTE  Helane Rima, MD SUMMARY OF HEMATOLOGIC HISTORY:  CHIEF COMPLAINTS/PURPOSE OF CONSULTATION:  Bilateral lymphadenopathy, concerning for lymphoma  HISTORY OF PRESENTING ILLNESS:  Autumn [name] 44 y.o. female is here because of bilateral palpable lymphadenopathy in the neck region. This patient has strong family history of lymphoma in her sister. Over the past month or so, she self palpated enlarging lymphadenopathy in the left side of the neck. She denies any tenderness. Denies any recent infection in the head and neck region. She thought she might have felt a lymph node in the right groin as well. She denies any night sweats, loss of appetite or weight.   CT scan of the neck dated 05/09/2015 show bilateral neck lymphadenopathy under 10 mm  INTERVAL HISTORY: Autumn [name] 44 y.o. female returns for  Further follow-up. She feels well. She denies new lymphadenopathy or changes in the size of existing lymphadenopathy  I have reviewed the past medical history, past surgical history, social history and family history with the patient and they are unchanged from previous note.  ALLERGIES:  is allergic to codeine.  MEDICATIONS:  Current Outpatient Prescriptions  Medication Sig Dispense Refill  . acetaminophen (TYLENOL) 325 MG tablet Take 650 mg by mouth every 6 (six) hours as needed.    Marland Kitchen ibuprofen (ADVIL,MOTRIN) 200 MG tablet Take 200 mg by mouth every 6 (six) hours as needed.     No current facility-administered medications for this visit.     REVIEW OF SYSTEMS:   Constitutional: Denies fevers, chills or night sweats Eyes: Denies blurriness of vision Ears, nose, mouth, throat, and face: Denies mucositis or sore throat Respiratory: Denies cough, dyspnea or wheezes Cardiovascular: Denies palpitation, chest discomfort or lower extremity swelling Gastrointestinal:  Denies nausea, heartburn or change in bowel habits Skin:  Denies abnormal skin rashes Neurological:Denies numbness, tingling or new weaknesses Behavioral/Psych: Mood is stable, no new changes  All other systems were reviewed with the patient and are negative.  PHYSICAL EXAMINATION: ECOG PERFORMANCE STATUS: 0 - Asymptomatic  Filed Vitals:   05/17/15 1456  BP: 124/76  Pulse: 84  Temp: 98 F (36.7 C)  Resp: 20   Filed Weights   05/17/15 1456  Weight: 152 lb 8 oz (69.174 kg)    GENERAL:alert, no distress and comfortable SKIN: skin color, texture, turgor are normal, no rashes or significant lesions EYES: normal, Conjunctiva are pink and non-injected, sclera clear OROPHARYNX:no exudate, no erythema and lips, buccal mucosa, and tongue normal  Musculoskeletal:no cyanosis of digits and no clubbing  NEURO: alert & oriented x 3 with fluent speech, no focal motor/sensory deficits  LABORATORY DATA:  I have reviewed the data as listed No results found for this or any previous visit (from the past 48 hour(s)).  Lab Results  Component Value Date   WBC 5.4 05/03/2015   HGB 12.2 05/03/2015   HCT 35.3 05/03/2015   MCV 85.5 05/03/2015   PLT 156 05/03/2015    RADIOGRAPHIC STUDIES: I reviewed the CT scan with her and her husband I have personally reviewed the radiological images as listed and agreed with the findings in the report.  ASSESSMENT & PLAN:  Lymphadenopathy of head and neck region Clinically, the lymph nodes are very small and could be reactive. Her blood work is unremarkable. I discussed options of watchful observation and recheck in 3 months rather than biopsy and she agreed with the plan of care. I addressed all questions and concerns   All questions  were answered. The patient knows to call the clinic with any problems, questions or concerns. No barriers to learning was detected.  I spent 15 minutes counseling the patient face to face. The total time spent in the appointment was 20 minutes and more than 50% was on  counseling.     [facility], [place] Skalicky, MD 7/29/20163:54 PM

## 2015-05-17 NOTE — Assessment & Plan Note (Signed)
Clinically, the lymph nodes are very small and could be reactive. Her blood work is unremarkable. I discussed options of watchful observation and recheck in 3 months rather than biopsy and she agreed with the plan of care. I addressed all questions and concerns

## 2015-05-17 NOTE — Telephone Encounter (Signed)
Gave adn printd appt sched and avs for pt for OCT

## 2015-05-20 ENCOUNTER — Ambulatory Visit
Admission: RE | Admit: 2015-05-20 | Discharge: 2015-05-20 | Disposition: A | Discharge status: Home / Self Care | Payer: 59 | Source: Ambulatory Visit

## 2015-05-20 DIAGNOSIS — Z1231 Encounter for screening mammogram for malignant neoplasm of breast: Secondary | ICD-10-CM

## 2015-07-30 ENCOUNTER — Other Ambulatory Visit (HOSPITAL_BASED_OUTPATIENT_CLINIC_OR_DEPARTMENT_OTHER): Payer: 59

## 2015-07-30 ENCOUNTER — Ambulatory Visit (HOSPITAL_BASED_OUTPATIENT_CLINIC_OR_DEPARTMENT_OTHER): Payer: 59 | Admitting: Hematology and Oncology

## 2015-07-30 ENCOUNTER — Telehealth: Payer: Self-pay | Admitting: Hematology and Oncology

## 2015-07-30 ENCOUNTER — Encounter: Payer: Self-pay | Admitting: Hematology and Oncology

## 2015-07-30 VITALS — BP 107/67 | HR 67 | Temp 98.0°F | Resp 20 | Ht 66.0 in | Wt 153.1 lb

## 2015-07-30 DIAGNOSIS — R599 Enlarged lymph nodes, unspecified: Secondary | ICD-10-CM | POA: Diagnosis not present

## 2015-07-30 DIAGNOSIS — R590 Localized enlarged lymph nodes: Secondary | ICD-10-CM

## 2015-07-30 LAB — CBC WITH DIFFERENTIAL/PLATELET
BASO%: 0.2 % (ref 0.0–2.0)
BASOS ABS: 0 10*3/uL (ref 0.0–0.1)
EOS ABS: 0.1 10*3/uL (ref 0.0–0.5)
EOS%: 0.9 % (ref 0.0–7.0)
HCT: 34.7 % — ABNORMAL LOW (ref 34.8–46.6)
HGB: 12 g/dL (ref 11.6–15.9)
LYMPH%: 28.9 % (ref 14.0–49.7)
MCH: 29.6 pg (ref 25.1–34.0)
MCHC: 34.6 g/dL (ref 31.5–36.0)
MCV: 85.7 fL (ref 79.5–101.0)
MONO#: 0.5 10*3/uL (ref 0.1–0.9)
MONO%: 9.5 % (ref 0.0–14.0)
NEUT%: 60.5 % (ref 38.4–76.8)
NEUTROS ABS: 3.5 10*3/uL (ref 1.5–6.5)
PLATELETS: 163 10*3/uL (ref 145–400)
RBC: 4.05 10*6/uL (ref 3.70–5.45)
RDW: 13.1 % (ref 11.2–14.5)
WBC: 5.7 10*3/uL (ref 3.9–10.3)
lymph#: 1.7 10*3/uL (ref 0.9–3.3)

## 2015-07-30 NOTE — Telephone Encounter (Signed)
Gave adn printed appt sched and avs for pt June

## 2015-07-30 NOTE — Progress Notes (Signed)
[place] OFFICE PROGRESS NOTE  Helane Rima, MD SUMMARY OF HEMATOLOGIC HISTORY: Autumn [name] 44 y.o. female is here because of bilateral palpable lymphadenopathy in the neck region. This patient has strong family history of lymphoma in her sister. She was referred to see me because of self palpated enlarging lymphadenopathy in the left side of the neck. She denies any tenderness. Denies any recent infection in the head and neck region. She thought she might have felt a lymph node in the right groin as well. CT scan of the neck on 05/09/2015 show small reactive lymphadenopathy. Screening blood tests for autoimmune diseases are negative and she was placed on observation. INTERVAL HISTORY: Autumn [name] 44 y.o. female returns for Further follow-up. She has persistent palpable lymphadenopathy, unchanged. Denies recent symptoms such as fevers, chills, night sweats or abnormal weight loss.  I have reviewed the past medical history, past surgical history, social history and family history with the patient and they are unchanged from previous note.  ALLERGIES:  is allergic to codeine.  MEDICATIONS:  Current Outpatient Prescriptions  Medication Sig Dispense Refill  . acetaminophen (TYLENOL) 325 MG tablet Take 650 mg by mouth every 6 (six) hours as needed.    Marland Kitchen ibuprofen (ADVIL,MOTRIN) 200 MG tablet Take 200 mg by mouth every 6 (six) hours as needed.     No current facility-administered medications for this visit.     REVIEW OF SYSTEMS:   Constitutional: Denies fevers, chills or night sweats Eyes: Denies blurriness of vision Ears, nose, mouth, throat, and face: Denies mucositis or sore throat Respiratory: Denies cough, dyspnea or wheezes Cardiovascular: Denies palpitation, chest discomfort or lower extremity swelling Gastrointestinal:  Denies nausea, heartburn or change in bowel habits Skin: Denies abnormal skin rashes Neurological:Denies numbness,  tingling or new weaknesses Behavioral/Psych: Mood is stable, no new changes  All other systems were reviewed with the patient and are negative.  PHYSICAL EXAMINATION: ECOG PERFORMANCE STATUS: 0 - Asymptomatic  Filed Vitals:   07/30/15 1330  BP: 107/67  Pulse: 67  Temp: 98 F (36.7 C)  Resp: 20   Filed Weights   07/30/15 1330  Weight: 153 lb 1.6 oz (69.446 kg)    GENERAL:alert, no distress and comfortable SKIN: skin color, texture, turgor are normal, no rashes or significant lesions EYES: normal, Conjunctiva are pink and non-injected, sclera clear OROPHARYNX:no exudate, no erythema and lips, buccal mucosa, and tongue normal  NECK: supple, thyroid normal size, non-tender, without nodularity LYMPH:  She has palpable lymphadenopathy in the left cervical region, left subclavicular region and fullness in the right inguinal region, unchanged compared to prior exam LUNGS: clear to auscultation and percussion with normal breathing effort HEART: regular rate & rhythm and no murmurs and no lower extremity edema ABDOMEN:abdomen soft, non-tender and normal bowel sounds Musculoskeletal:no cyanosis of digits and no clubbing  NEURO: alert & oriented x 3 with fluent speech, no focal motor/sensory deficits  LABORATORY DATA:  I have reviewed the data as listed Results for orders placed or performed in visit on 07/30/15 (from the past 48 hour(s))  CBC with Differential/Platelet     Status: Abnormal   Collection Time: 07/30/15  1:06 PM  Result Value Ref Range   WBC 5.7 3.9 - 10.3 10e3/uL   NEUT# 3.5 1.5 - 6.5 10e3/uL   HGB 12.0 11.6 - 15.9 g/dL   HCT 34.7 (L) 34.8 - 46.6 %   Platelets 163 145 - 400 10e3/uL   MCV 85.7 79.5 - 101.0 fL  MCH 29.6 25.1 - 34.0 pg   MCHC 34.6 31.5 - 36.0 g/dL   RBC 4.05 3.70 - 5.45 10e6/uL   RDW 13.1 11.2 - 14.5 %   lymph# 1.7 0.9 - 3.3 10e3/uL   MONO# 0.5 0.1 - 0.9 10e3/uL   Eosinophils Absolute 0.1 0.0 - 0.5 10e3/uL   Basophils Absolute 0.0 0.0 - 0.1  10e3/uL   NEUT% 60.5 38.4 - 76.8 %   LYMPH% 28.9 14.0 - 49.7 %   MONO% 9.5 0.0 - 14.0 %   EOS% 0.9 0.0 - 7.0 %   BASO% 0.2 0.0 - 2.0 %    Lab Results  Component Value Date   WBC 5.7 07/30/2015   HGB 12.0 07/30/2015   HCT 34.7* 07/30/2015   MCV 85.7 07/30/2015   PLT 163 07/30/2015   ASSESSMENT & PLAN:  Lymphadenopathy of head and neck region Clinically, the lymph nodes are very small and could be reactive. Her blood work is unremarkable and examination is unchanged I discussed options of watchful observation and recheck in 8 months rather than biopsy and she agreed with the plan of care. I addressed all questions and concerns     All questions were answered. The patient knows to call the clinic with any problems, questions or concerns. No barriers to learning was detected.  I spent 15 minutes counseling the patient face to face. The total time spent in the appointment was 20 minutes and more than 50% was on counseling.     [facility], [place], MD 10/11/20162:03 PM

## 2015-07-30 NOTE — Assessment & Plan Note (Signed)
Clinically, the lymph nodes are very small and could be reactive. Her blood work is unremarkable and examination is unchanged I discussed options of watchful observation and recheck in 8 months rather than biopsy and she agreed with the plan of care. I addressed all questions and concerns

## 2016-03-30 ENCOUNTER — Encounter: Payer: Self-pay | Admitting: Hematology and Oncology

## 2016-03-30 ENCOUNTER — Telehealth: Payer: Self-pay | Admitting: Hematology and Oncology

## 2016-03-30 ENCOUNTER — Other Ambulatory Visit (HOSPITAL_BASED_OUTPATIENT_CLINIC_OR_DEPARTMENT_OTHER): Payer: Managed Care, Other (non HMO)

## 2016-03-30 ENCOUNTER — Ambulatory Visit (HOSPITAL_BASED_OUTPATIENT_CLINIC_OR_DEPARTMENT_OTHER): Payer: 59 | Admitting: Hematology and Oncology

## 2016-03-30 VITALS — BP 116/76 | HR 65 | Temp 98.1°F | Resp 18 | Ht 66.0 in | Wt 151.7 lb

## 2016-03-30 DIAGNOSIS — R590 Localized enlarged lymph nodes: Secondary | ICD-10-CM

## 2016-03-30 DIAGNOSIS — R599 Enlarged lymph nodes, unspecified: Secondary | ICD-10-CM | POA: Diagnosis not present

## 2016-03-30 LAB — CBC WITH DIFFERENTIAL/PLATELET
BASO%: 0.6 % (ref 0.0–2.0)
BASOS ABS: 0 10*3/uL (ref 0.0–0.1)
EOS ABS: 0.1 10*3/uL (ref 0.0–0.5)
EOS%: 1.2 % (ref 0.0–7.0)
HCT: 39 % (ref 34.8–46.6)
HGB: 13 g/dL (ref 11.6–15.9)
LYMPH%: 29.6 % (ref 14.0–49.7)
MCH: 29 pg (ref 25.1–34.0)
MCHC: 33.3 g/dL (ref 31.5–36.0)
MCV: 87.1 fL (ref 79.5–101.0)
MONO#: 0.5 10*3/uL (ref 0.1–0.9)
MONO%: 8.3 % (ref 0.0–14.0)
NEUT%: 60.3 % (ref 38.4–76.8)
NEUTROS ABS: 3.4 10*3/uL (ref 1.5–6.5)
Platelets: 156 10*3/uL (ref 145–400)
RBC: 4.48 10*6/uL (ref 3.70–5.45)
RDW: 13.9 % (ref 11.2–14.5)
WBC: 5.6 10*3/uL (ref 3.9–10.3)
lymph#: 1.6 10*3/uL (ref 0.9–3.3)

## 2016-03-30 NOTE — Telephone Encounter (Signed)
per of to sch pt appt-gave pt copy of avs °

## 2016-03-30 NOTE — Progress Notes (Signed)
[place] OFFICE PROGRESS NOTE  Autumn Rima, MD SUMMARY OF HEMATOLOGIC HISTORY: Autumn [name] is seen here because of bilateral palpable lymphadenopathy in the neck region. This patient has strong family history of lymphoma in her sister. She was referred to see me because of self palpated enlarging lymphadenopathy in the left side of the neck. She denies any tenderness. Denies any recent infection in the head and neck region. She thought she might have felt a lymph node in the right groin as well. CT scan of the neck on 05/09/2015 show small reactive lymphadenopathy. Screening blood tests for autoimmune diseases are negative and she was placed on observation INTERVAL HISTORY: Autumn [name] 45 y.o. female returns for further follow-up. She denies new lymphadenopathy or changes in the existing lymphadenopathy. Denies fever, night sweats, anorexia or weight loss Denies recent infection  I have reviewed the past medical history, past surgical history, social history and family history with the patient and they are unchanged from previous note.  ALLERGIES:  is allergic to codeine.  MEDICATIONS:  Current Outpatient Prescriptions  Medication Sig Dispense Refill  . acetaminophen (TYLENOL) 325 MG tablet Take 650 mg by mouth every 6 (six) hours as needed.    Marland Kitchen ibuprofen (ADVIL,MOTRIN) 200 MG tablet Take 200 mg by mouth every 6 (six) hours as needed.     No current facility-administered medications for this visit.     REVIEW OF SYSTEMS:   Constitutional: Denies fevers, chills or night sweats Eyes: Denies blurriness of vision Ears, nose, mouth, throat, and face: Denies mucositis or sore throat Respiratory: Denies cough, dyspnea or wheezes Cardiovascular: Denies palpitation, chest discomfort or lower extremity swelling Gastrointestinal:  Denies nausea, heartburn or change in bowel habits Skin: Denies abnormal skin rashes Lymphatics: Denies new  lymphadenopathy or easy bruising Neurological:Denies numbness, tingling or new weaknesses Behavioral/Psych: Mood is stable, no new changes  All other systems were reviewed with the patient and are negative.  PHYSICAL EXAMINATION: ECOG PERFORMANCE STATUS: 0 - Asymptomatic  Filed Vitals:   03/30/16 0908  BP: 116/76  Pulse: 65  Temp: 98.1 F (36.7 C)  Resp: 18   Filed Weights   03/30/16 0908  Weight: 151 lb 11.2 oz (68.811 kg)    GENERAL:alert, no distress and comfortable SKIN: skin color, texture, turgor are normal, no rashes or significant lesions EYES: normal, Conjunctiva are pink and non-injected, sclera clear OROPHARYNX:no exudate, no erythema and lips, buccal mucosa, and tongue normal  NECK: supple, thyroid normal size, non-tender, without nodularity LYMPH: She has palpable lymphadenopathy on both sides of the neck. There is fullness on palpation in the groin region  LUNGS: clear to auscultation and percussion with normal breathing effort HEART: regular rate & rhythm and no murmurs and no lower extremity edema ABDOMEN:abdomen soft, non-tender and normal bowel sounds Musculoskeletal:no cyanosis of digits and no clubbing  NEURO: alert & oriented x 3 with fluent speech, no focal motor/sensory deficits  LABORATORY DATA:  I have reviewed the data as listed     Component Value Date/Time   NA 139 07/12/2012 0949   K 4.0 07/12/2012 0949   CL 105 07/12/2012 0949   CO2 27 07/12/2012 0949   GLUCOSE 91 07/12/2012 0949   BUN 18 07/12/2012 0949   CREATININE 0.75 07/12/2012 0949   CALCIUM 9.1 07/12/2012 0949   PROT 6.5 07/12/2012 0949   ALBUMIN 4.6 07/12/2012 0949   AST 16 07/12/2012 0949   ALT 11 07/12/2012 0949   ALKPHOS 51 07/12/2012 0949  BILITOT 0.8 07/12/2012 0949    No results found for: SPEP, UPEP  Lab Results  Component Value Date   WBC 5.6 03/30/2016   NEUTROABS 3.4 03/30/2016   HGB 13.0 03/30/2016   HCT 39.0 03/30/2016   MCV 87.1 03/30/2016   PLT 156  03/30/2016      Chemistry      Component Value Date/Time   NA 139 07/12/2012 0949   K 4.0 07/12/2012 0949   CL 105 07/12/2012 0949   CO2 27 07/12/2012 0949   BUN 18 07/12/2012 0949   CREATININE 0.75 07/12/2012 0949      Component Value Date/Time   CALCIUM 9.1 07/12/2012 0949   ALKPHOS 51 07/12/2012 0949   AST 16 07/12/2012 0949   ALT 11 07/12/2012 0949   BILITOT 0.8 07/12/2012 0949      ASSESSMENT & PLAN:  Lymphadenopathy of head and neck region Clinically, the lymph nodes are very small without evidence of progression Her blood work is unremarkable and examination is unchanged I discussed options of watchful observation and recheck in 6 months rather than biopsy and she agreed with the plan of care We also consider possibility of repeating imaging studies I suspect the patient may have low-grade lymphoma given lack of regression and positive family history Due to lack of symptoms, even if we prove that this is low-grade lymphoma, there is no indication to proceed with treatment I educated the patient signs and symptoms to watch for for disease progression such as unexplained anorexia, weight loss, night sweats or rapid changes in the size of the lymph node I addressed all questions and concerns   All questions were answered. The patient knows to call the clinic with any problems, questions or concerns. No barriers to learning was detected.  I spent 15 minutes counseling the patient face to face. The total time spent in the appointment was 20 minutes and more than 50% was on counseling.     Zilah Villaflor, MD 6/12/201710:00 AM

## 2016-03-30 NOTE — Assessment & Plan Note (Signed)
Clinically, the lymph nodes are very small without evidence of progression Her blood work is unremarkable and examination is unchanged I discussed options of watchful observation and recheck in 6 months rather than biopsy and she agreed with the plan of care We also consider possibility of repeating imaging studies I suspect the patient may have low-grade lymphoma given lack of regression and positive family history Due to lack of symptoms, even if we prove that this is low-grade lymphoma, there is no indication to proceed with treatment I educated the patient signs and symptoms to watch for for disease progression such as unexplained anorexia, weight loss, night sweats or rapid changes in the size of the lymph node I addressed all questions and concerns

## 2016-05-28 ENCOUNTER — Telehealth: Payer: Self-pay | Admitting: *Deleted

## 2016-05-28 NOTE — Telephone Encounter (Signed)
Informed pt of appt on Friday 8/18 2:15 pm for lab and 2:45 pm for Dr. Alvy Bimler.  She says she can make this appt and was thankful.   Message sent to scheduling to request appts above.

## 2016-05-28 NOTE — Telephone Encounter (Signed)
Sure. 245 pm, 30 mins 8/18 with labs prior Please place scheduling msg if it works out

## 2016-05-28 NOTE — Telephone Encounter (Signed)
I'm already overbooked today I can see her next Thursday at 315 pm. If OK with her, please place orders for labs and appt that day

## 2016-05-28 NOTE — Telephone Encounter (Signed)
Pt lvm reports Lymph node on left side of her neck has "greatly enlarged" over the past week.  States it is "very large" and visible.  Also tender to touch.  She had a cold a few weeks ago with sore throat and cough.  Still has occasional cough.   She is leaving on vacation tomorrow morning and returns next Thursday.  Can she make appt to see Dr. Alvy Bimler today or next week after Thursday?

## 2016-05-28 NOTE — Telephone Encounter (Signed)
Pt says she will not be back home until late next Thursday.  Can she come next Friday?

## 2016-06-02 ENCOUNTER — Telehealth: Payer: Self-pay | Admitting: *Deleted

## 2016-06-02 IMAGING — CT CT NECK W/ CM
4 of 6 series · 16 of 33 positions shown, 18 images · IV contrast (OMNIPAQUE)
Comparison: None.

CLINICAL DATA: Cervical lymphadenopathy .  No history of cancer.

EXAM:
CT NECK WITH CONTRAST
TECHNIQUE: Multidetector CT imaging of the neck was performed using the
standard protocol following the bolus administration of intravenous
contrast.
CONTRAST:  80mL OMNIPAQUE IOHEXOL 300 MG/ML  SOLN

[Series 2: neck st · axial · 0.45mm/px · z∈[+1041,+1177]mm · 5 of 104 slices shown, 7 images]
[im 18/104  soft-tissue]
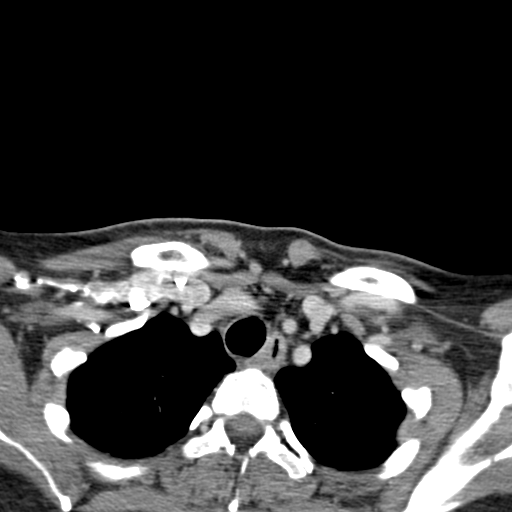
[im 18/104  bone]
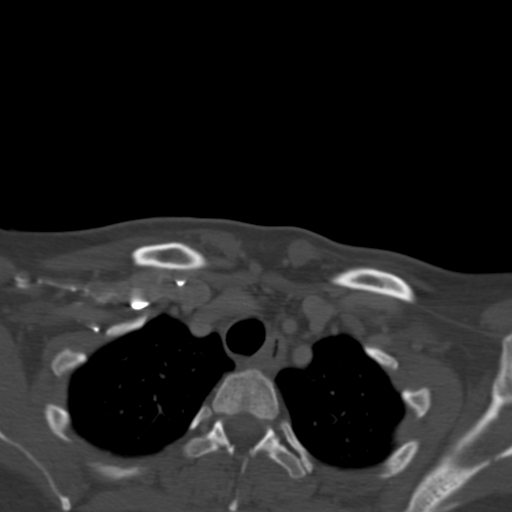
[im 35/104  bone]
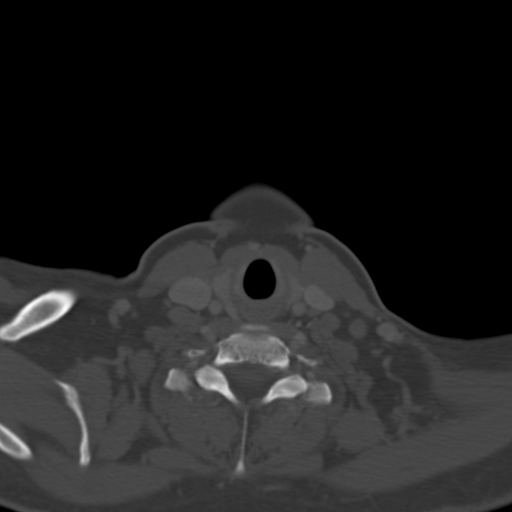
[im 52/104  bone]
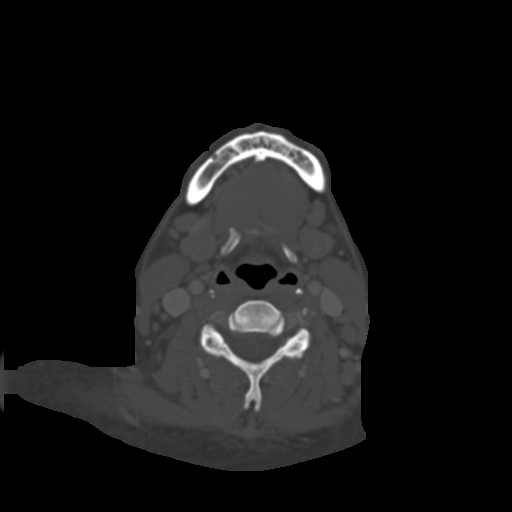
[im 69/104  bone]
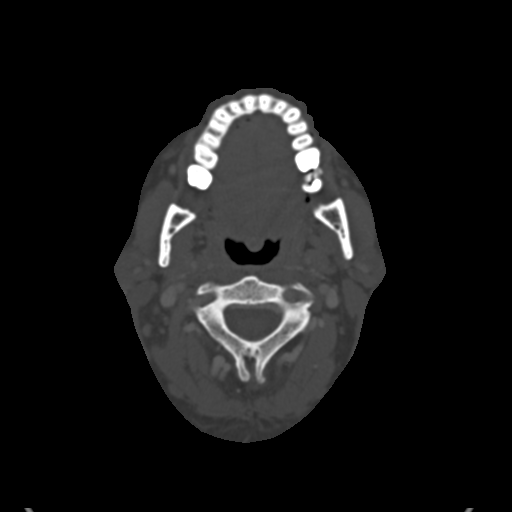
[im 86/104  soft-tissue]
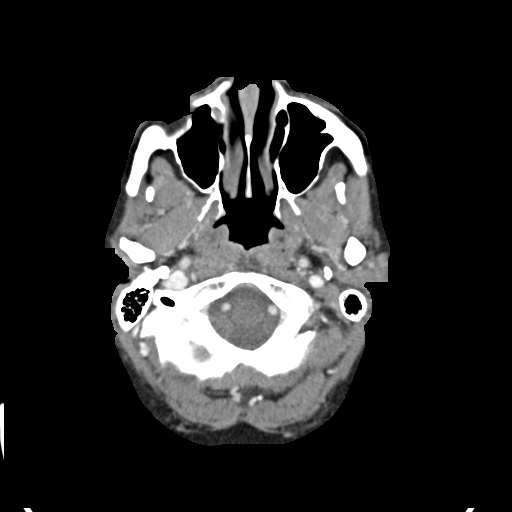
[im 86/104  bone]
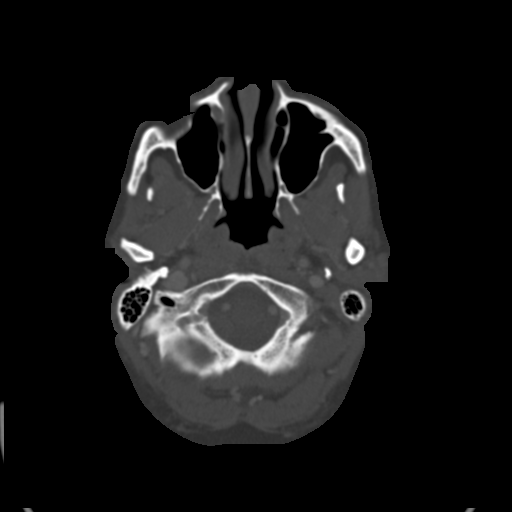

[Series 602: <mpr thick range> · sagittal · 0.45mm/px · 5 of 70 slices shown]
[im 12/70  bone]
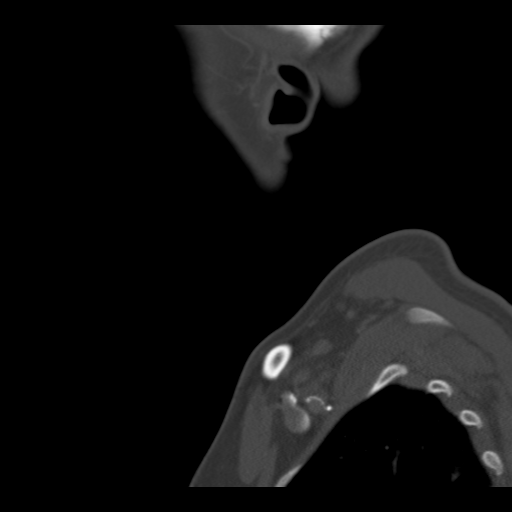
[im 24/70  bone]
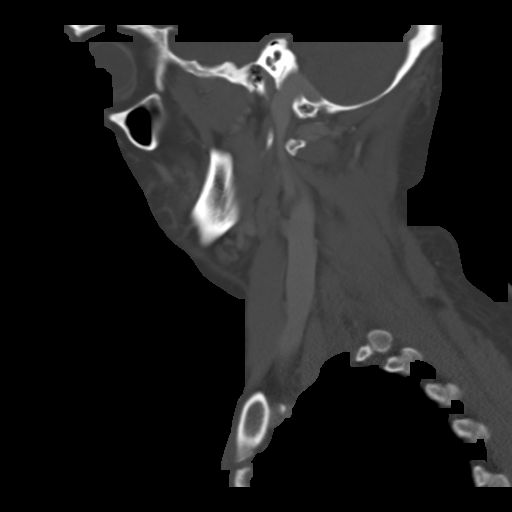
[im 35/70  bone]
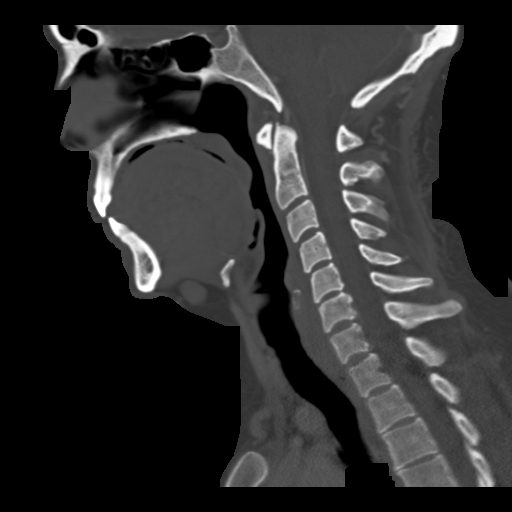
[im 47/70  bone]
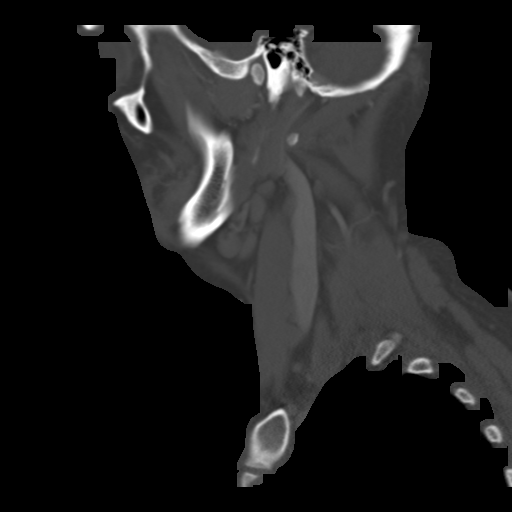
[im 58/70  bone]
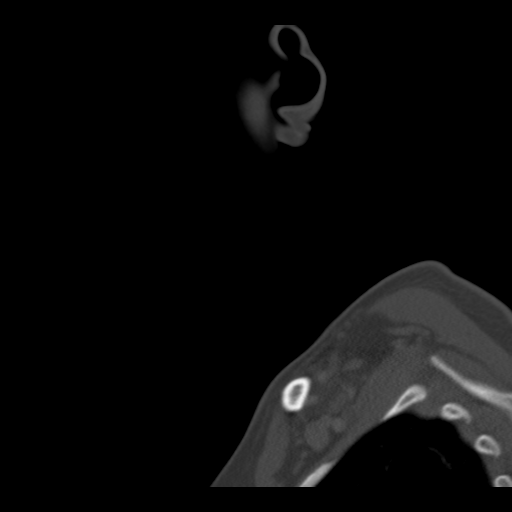

[Series 604: <mpr thick range(2)> · coronal · 0.45mm/px · 3 of 69 slices shown]
[im 17/69  bone]
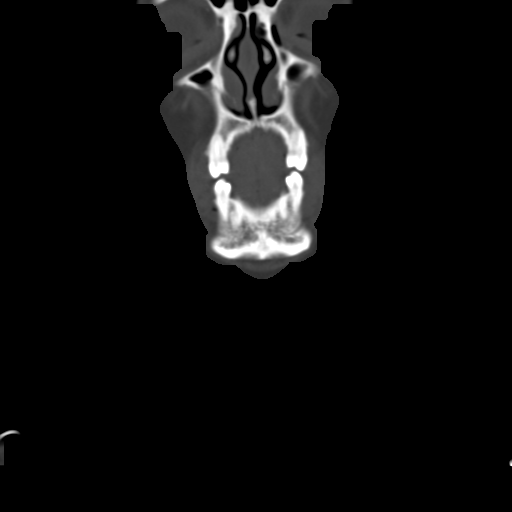
[im 29/69  bone]
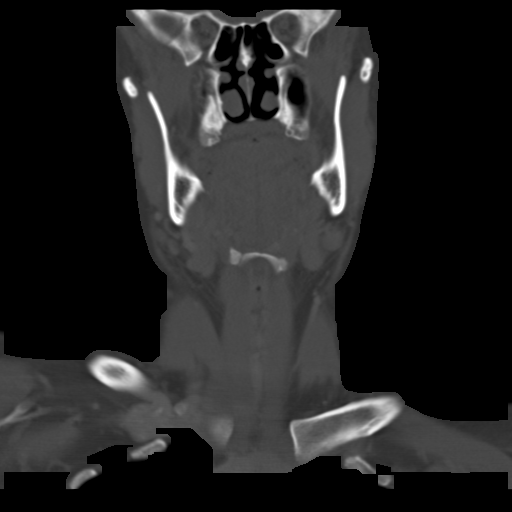
[im 40/69  bone]
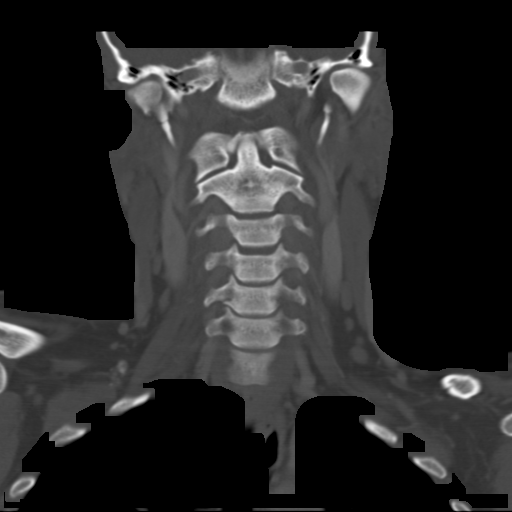

[Series 605: <mpr thick range(3)> · axial · 0.45mm/px · z∈[+1032,+1140]mm · 3 of 74 slices shown]
[im 19/74  bone]
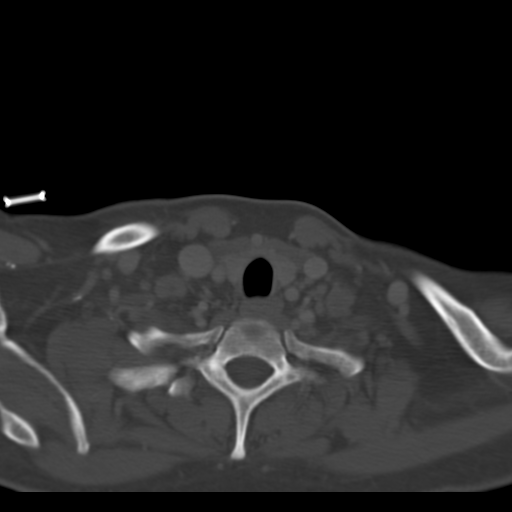
[im 37/74  bone]
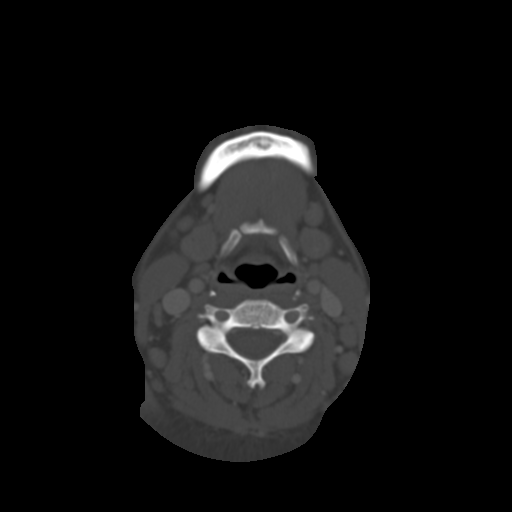
[im 55/74  bone]
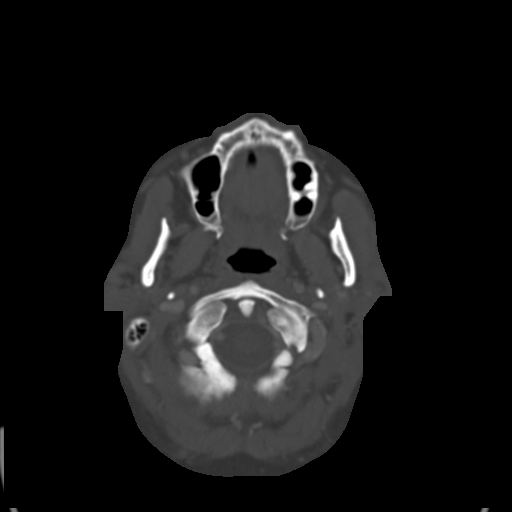

[16 of 33 positions shown; findings below may reference images not displayed]

FINDINGS: Pharynx and larynx:   negative

Salivary glands: Negative

Thyroid: Negative

Lymph nodes: Prominent cervical lymph nodes bilaterally and
relatively symmetric. On the right, submandibular node 8 mm.
Submental node a 10 mm . Level 2 node 7.0 mm. Multiple posterior
lymph nodes measuring 5mm and 8 mm. No supraclavicular nodes. On the
left, submandibular node 6.7 mm. Submandibular nodes 7.0 mm.
Submental node 9.7 mm. Posterior lymph nodes measuring 8 mm and 9 mm
and 6 mm. No supraclavicular adenopathy.

Vascular: Negative

Limited intracranial: Negative

Visualized orbits: Negative

Mastoids and visualized paranasal sinuses: Negative

Skeleton: Negative

Upper chest: Clear
IMPRESSION: Prominent bilateral and symmetric cervical adenopathy. No primary
neoplasm of the head and neck region is identified. Differential
includes lymphoma or hyperplastic benign lymph nodes. Consideration
should be given to lymph node biopsy. If biopsy is not performed,
close clinical follow-up is recommended to assure these resolve. If
these lymph nodes do not resolve then biopsy would be recommended at
that time.

## 2016-06-02 NOTE — Telephone Encounter (Signed)
Per LOS I have scheduled appats. Patient already aware

## 2016-06-04 ENCOUNTER — Other Ambulatory Visit: Payer: Self-pay | Admitting: Hematology and Oncology

## 2016-06-04 DIAGNOSIS — C9110 Chronic lymphocytic leukemia of B-cell type not having achieved remission: Secondary | ICD-10-CM | POA: Insufficient documentation

## 2016-06-05 ENCOUNTER — Encounter: Payer: Self-pay | Admitting: Hematology and Oncology

## 2016-06-05 ENCOUNTER — Other Ambulatory Visit (HOSPITAL_BASED_OUTPATIENT_CLINIC_OR_DEPARTMENT_OTHER): Payer: Managed Care, Other (non HMO)

## 2016-06-05 ENCOUNTER — Ambulatory Visit (HOSPITAL_BASED_OUTPATIENT_CLINIC_OR_DEPARTMENT_OTHER): Payer: Managed Care, Other (non HMO) | Admitting: Hematology and Oncology

## 2016-06-05 ENCOUNTER — Other Ambulatory Visit (HOSPITAL_COMMUNITY)
Admission: RE | Admit: 2016-06-05 | Discharge: 2016-06-05 | Disposition: A | Discharge status: Home / Self Care | Payer: Managed Care, Other (non HMO) | Source: Ambulatory Visit | Attending: Hematology and Oncology | Admitting: Hematology and Oncology

## 2016-06-05 VITALS — BP 120/74 | HR 66 | Temp 98.5°F | Resp 18 | Ht 66.0 in | Wt 149.8 lb

## 2016-06-05 DIAGNOSIS — C9110 Chronic lymphocytic leukemia of B-cell type not having achieved remission: Secondary | ICD-10-CM

## 2016-06-05 DIAGNOSIS — R599 Enlarged lymph nodes, unspecified: Secondary | ICD-10-CM | POA: Diagnosis not present

## 2016-06-05 DIAGNOSIS — R590 Localized enlarged lymph nodes: Secondary | ICD-10-CM

## 2016-06-05 DIAGNOSIS — Z807 Family history of other malignant neoplasms of lymphoid, hematopoietic and related tissues: Secondary | ICD-10-CM

## 2016-06-05 LAB — CBC WITH DIFFERENTIAL/PLATELET
BASO%: 0.8 % (ref 0.0–2.0)
Basophils Absolute: 0 10*3/uL (ref 0.0–0.1)
EOS ABS: 0.1 10*3/uL (ref 0.0–0.5)
EOS%: 2.1 % (ref 0.0–7.0)
HEMATOCRIT: 37.3 % (ref 34.8–46.6)
HEMOGLOBIN: 12.3 g/dL (ref 11.6–15.9)
LYMPH#: 1.6 10*3/uL (ref 0.9–3.3)
LYMPH%: 29.7 % (ref 14.0–49.7)
MCH: 28.7 pg (ref 25.1–34.0)
MCHC: 33.1 g/dL (ref 31.5–36.0)
MCV: 86.7 fL (ref 79.5–101.0)
MONO#: 0.5 10*3/uL (ref 0.1–0.9)
MONO%: 8.9 % (ref 0.0–14.0)
NEUT%: 58.5 % (ref 38.4–76.8)
NEUTROS ABS: 3.1 10*3/uL (ref 1.5–6.5)
PLATELETS: 167 10*3/uL (ref 145–400)
RBC: 4.3 10*6/uL (ref 3.70–5.45)
RDW: 13.4 % (ref 11.2–14.5)
WBC: 5.2 10*3/uL (ref 3.9–10.3)

## 2016-06-05 LAB — COMPREHENSIVE METABOLIC PANEL
ALBUMIN: 3.9 g/dL (ref 3.5–5.0)
ALK PHOS: 65 U/L (ref 40–150)
ALT: 14 U/L (ref 0–55)
ANION GAP: 6 meq/L (ref 3–11)
AST: 19 U/L (ref 5–34)
BILIRUBIN TOTAL: 0.67 mg/dL (ref 0.20–1.20)
BUN: 13.7 mg/dL (ref 7.0–26.0)
CO2: 27 mEq/L (ref 22–29)
Calcium: 9.1 mg/dL (ref 8.4–10.4)
Chloride: 108 mEq/L (ref 98–109)
Creatinine: 0.8 mg/dL (ref 0.6–1.1)
GLUCOSE: 94 mg/dL (ref 70–140)
Potassium: 3.8 mEq/L (ref 3.5–5.1)
SODIUM: 141 meq/L (ref 136–145)
TOTAL PROTEIN: 6.8 g/dL (ref 6.4–8.3)

## 2016-06-05 LAB — LACTATE DEHYDROGENASE: LDH: 241 U/L (ref 125–245)

## 2016-06-05 LAB — URIC ACID: Uric Acid, Serum: 2.9 mg/dl (ref 2.6–7.4)

## 2016-06-05 NOTE — Assessment & Plan Note (Signed)
The lymphadenopathy has progressed Her overall presentation is highly suspicious for lymphoma. We discussed imaging study and biopsy. She is in agreement for direct referral to ENT for surgical excisional lymph node biopsy. I have called to make the referral. I will see her back after biopsy is completed to review test results and plan of care

## 2016-06-05 NOTE — Progress Notes (Signed)
[facility] OFFICE PROGRESS NOTE  Patient Care Team: Helane Rima, MD as PCP - General (Family Medicine) Vicie Mutters, MD as Consulting Physician (Otolaryngology)  SUMMARY OF ONCOLOGIC HISTORY:  Autumn [name] is seen here because of bilateral palpable lymphadenopathy in the neck region. This patient has strong family history of lymphoma in her sister. She was referred to see me because of self palpated enlarging lymphadenopathy in the left side of the neck. She denies any tenderness. Denies any recent infection in the head and neck region. She thought she might have felt a lymph node in the right groin as well. CT scan of the neck on 05/09/2015 show small reactive lymphadenopathy. Screening blood tests for autoimmune diseases are negative and she was placed on observation  INTERVAL HISTORY: Please see below for problem oriented charting. She call you for urgent evaluation because of progression of lymphadenopathy. They had caused some discomfort. She denies night sweats, anorexia or abnormal weight loss She had recent URI that resolved more than 3 weeks ago  REVIEW OF SYSTEMS:   Constitutional: Denies fevers, chills or abnormal weight loss Eyes: Denies blurriness of vision Ears, nose, mouth, throat, and face: Denies mucositis or sore throat Respiratory: Denies cough, dyspnea or wheezes Cardiovascular: Denies palpitation, chest discomfort or lower extremity swelling Gastrointestinal:  Denies nausea, heartburn or change in bowel habits Skin: Denies abnormal skin rashes Neurological:Denies numbness, tingling or new weaknesses Behavioral/Psych: Mood is stable, no new changes  All other systems were reviewed with the patient and are negative.  I have reviewed the past medical history, past surgical history, social history and family history with the patient and they are unchanged from previous note.  ALLERGIES:  is allergic to codeine.  MEDICATIONS:  Current  Outpatient Prescriptions  Medication Sig Dispense Refill  . acetaminophen (TYLENOL) 325 MG tablet Take 650 mg by mouth every 6 (six) hours as needed.    Marland Kitchen ibuprofen (ADVIL,MOTRIN) 200 MG tablet Take 200 mg by mouth every 6 (six) hours as needed.     No current facility-administered medications for this visit.     PHYSICAL EXAMINATION: ECOG PERFORMANCE STATUS: 1 - Symptomatic but completely ambulatory  Vitals:   06/05/16 1500  BP: 120/74  Pulse: 66  Resp: 18  Temp: 98.5 F (36.9 C)   Filed Weights   06/05/16 1500  Weight: 149 lb 12.8 oz (67.9 kg)    GENERAL:alert, no distress and comfortable SKIN: skin color, texture, turgor are normal, no rashes or significant lesions EYES: normal, Conjunctiva are pink and non-injected, sclera clear OROPHARYNX:no exudate, no erythema and lips, buccal mucosa, and tongue normal  NECK: She has diffuse, palpable lymphadenopathy which has progressed in size since her last visit  LYMPH:  She has no palpable lymphadenopathy in the axilla or inguinal region LUNGS: clear to auscultation and percussion with normal breathing effort HEART: regular rate & rhythm and no murmurs and no lower extremity edema ABDOMEN:abdomen soft, non-tender and normal bowel sounds Musculoskeletal:no cyanosis of digits and no clubbing  NEURO: alert & oriented x 3 with fluent speech, no focal motor/sensory deficits  LABORATORY DATA:  I have reviewed the data as listed    Component Value Date/Time   NA 139 07/12/2012 0949   K 4.0 07/12/2012 0949   CL 105 07/12/2012 0949   CO2 27 07/12/2012 0949   GLUCOSE 91 07/12/2012 0949   BUN 18 07/12/2012 0949   CREATININE 0.75 07/12/2012 0949   CALCIUM 9.1 07/12/2012 0949   PROT 6.5 07/12/2012  0949   ALBUMIN 4.6 07/12/2012 0949   AST 16 07/12/2012 0949   ALT 11 07/12/2012 0949   ALKPHOS 51 07/12/2012 0949   BILITOT 0.8 07/12/2012 0949    No results found for: SPEP, UPEP  Lab Results  Component Value Date   WBC 5.2  06/05/2016   NEUTROABS 3.1 06/05/2016   HGB 12.3 06/05/2016   HCT 37.3 06/05/2016   MCV 86.7 06/05/2016   PLT 167 06/05/2016      Chemistry      Component Value Date/Time   NA 139 07/12/2012 0949   K 4.0 07/12/2012 0949   CL 105 07/12/2012 0949   CO2 27 07/12/2012 0949   BUN 18 07/12/2012 0949   CREATININE 0.75 07/12/2012 0949      Component Value Date/Time   CALCIUM 9.1 07/12/2012 0949   ALKPHOS 51 07/12/2012 0949   AST 16 07/12/2012 0949   ALT 11 07/12/2012 0949   BILITOT 0.8 07/12/2012 0949      ASSESSMENT & PLAN:  Lymphadenopathy of head and neck region The lymphadenopathy has progressed Her overall presentation is highly suspicious for lymphoma. We discussed imaging study and biopsy. She is in agreement for direct referral to ENT for surgical excisional lymph node biopsy. I have called to make the referral. I will see her back after biopsy is completed to review test results and plan of care   Orders Placed This Encounter  Procedures  . Ambulatory referral to ENT    Referral Priority:   Urgent    Referral Type:   Consultation    Referral Reason:   Specialty Services Required    Referred to Provider:   Rozetta Nunnery, MD    Requested Specialty:   Otolaryngology    Number of Visits Requested:   1   All questions were answered. The patient knows to call the clinic with any problems, questions or concerns. No barriers to learning was detected. I spent 15 minutes counseling the patient face to face. The total time spent in the appointment was 20 minutes and more than 50% was on counseling and review of test results     [facility], [place], MD 06/05/2016 3:24 PM

## 2016-06-06 LAB — HEPATITIS B SURFACE ANTIBODY,QUALITATIVE: HEP B SURFACE AB, QUAL: REACTIVE

## 2016-06-06 LAB — HEPATITIS B SURFACE ANTIGEN: HEP B S AG: NEGATIVE

## 2016-06-06 LAB — HEPATITIS B CORE ANTIBODY, IGM: Hep B Core Ab, IgM: NEGATIVE

## 2016-06-08 ENCOUNTER — Telehealth: Payer: Self-pay | Admitting: *Deleted

## 2016-06-08 NOTE — Telephone Encounter (Signed)
Appt scheduled with Dr Radene Journey on Thursday, 8/24 @ 1515. Notes faxed to office. Pt is aware of appt

## 2016-06-10 LAB — FLOW CYTOMETRY

## 2016-06-19 ENCOUNTER — Telehealth: Payer: Self-pay | Admitting: *Deleted

## 2016-06-19 NOTE — Telephone Encounter (Signed)
Called pt to f/u on her appt w/ Dr. Lucia Gaskins.  She saw him last Thursday and he prescribed 2 weeks of antibiotics.  She has not noticed any improvement yet.  She sees him again next Thursday 9/7 and he will schedule Biopsy if no improvement on the antibiotics.  Pt will call us back and let us know Biopsy date as soon as she is scheduled.

## 2016-06-22 ENCOUNTER — Emergency Department (HOSPITAL_COMMUNITY): Payer: Managed Care, Other (non HMO)

## 2016-06-22 ENCOUNTER — Encounter (HOSPITAL_COMMUNITY): Payer: Self-pay | Admitting: Emergency Medicine

## 2016-06-22 ENCOUNTER — Emergency Department (HOSPITAL_COMMUNITY)
Admission: EM | Admit: 2016-06-22 | Discharge: 2016-06-22 | Disposition: A | Discharge status: Home / Self Care | Payer: Managed Care, Other (non HMO) | Attending: Emergency Medicine | Admitting: Emergency Medicine

## 2016-06-22 DIAGNOSIS — R590 Localized enlarged lymph nodes: Secondary | ICD-10-CM | POA: Insufficient documentation

## 2016-06-22 DIAGNOSIS — R109 Unspecified abdominal pain: Secondary | ICD-10-CM | POA: Diagnosis present

## 2016-06-22 DIAGNOSIS — Z79899 Other long term (current) drug therapy: Secondary | ICD-10-CM | POA: Insufficient documentation

## 2016-06-22 LAB — COMPREHENSIVE METABOLIC PANEL
ALT: 20 U/L (ref 14–54)
AST: 26 U/L (ref 15–41)
Albumin: 4.2 g/dL (ref 3.5–5.0)
Alkaline Phosphatase: 62 U/L (ref 38–126)
Anion gap: 4 — ABNORMAL LOW (ref 5–15)
BILIRUBIN TOTAL: 0.7 mg/dL (ref 0.3–1.2)
BUN: 9 mg/dL (ref 6–20)
CO2: 27 mmol/L (ref 22–32)
Calcium: 9.3 mg/dL (ref 8.9–10.3)
Chloride: 108 mmol/L (ref 101–111)
Creatinine, Ser: 0.69 mg/dL (ref 0.44–1.00)
GFR calc Af Amer: >60 mL/min (ref >60)
Glucose, Bld: 108 mg/dL — ABNORMAL HIGH (ref 65–99)
POTASSIUM: 3.6 mmol/L (ref 3.5–5.1)
Sodium: 139 mmol/L (ref 135–145)
TOTAL PROTEIN: 7.1 g/dL (ref 6.5–8.1)

## 2016-06-22 LAB — URINALYSIS, ROUTINE W REFLEX MICROSCOPIC
BILIRUBIN URINE: NEGATIVE
GLUCOSE, UA: NEGATIVE mg/dL
Hgb urine dipstick: NEGATIVE
KETONES UR: NEGATIVE mg/dL
Leukocytes, UA: NEGATIVE
Nitrite: NEGATIVE
PH: 8 (ref 5.0–8.0)
Protein, ur: NEGATIVE mg/dL
Specific Gravity, Urine: 1.011 (ref 1.005–1.030)

## 2016-06-22 LAB — CBC
HEMATOCRIT: 36.3 % (ref 36.0–46.0)
Hemoglobin: 12.7 g/dL (ref 12.0–15.0)
MCH: 29.8 pg (ref 26.0–34.0)
MCHC: 35 g/dL (ref 30.0–36.0)
MCV: 85.2 fL (ref 78.0–100.0)
PLATELETS: 129 10*3/uL — AB (ref 150–400)
RBC: 4.26 MIL/uL (ref 3.87–5.11)
RDW: 13.4 % (ref 11.5–15.5)
WBC: 6.6 10*3/uL (ref 4.0–10.5)

## 2016-06-22 LAB — I-STAT BETA HCG BLOOD, ED (MC, WL, AP ONLY): I-stat hCG, quantitative: <5 m[IU]/mL (ref <5)

## 2016-06-22 LAB — LIPASE, BLOOD: Lipase: 24 U/L (ref 11–51)

## 2016-06-22 MED ORDER — IOPAMIDOL (ISOVUE-300) INJECTION 61%
100.0000 mL | Freq: Once | INTRAVENOUS | Status: AC | PRN
  Administered 2016-06-22: 100 mL via INTRAVENOUS

## 2016-06-22 MED ORDER — IBUPROFEN 800 MG PO TABS: 800.0000 mg | ORAL_TABLET | Freq: Three times a day (TID) | ORAL | 0 refills | Status: DC

## 2016-06-22 MED ORDER — HYDROMORPHONE HCL 1 MG/ML IJ SOLN
1.0000 mg | Freq: Once | INTRAMUSCULAR | Status: DC
  Filled 2016-06-22: qty 1

## 2016-06-22 MED ORDER — ONDANSETRON HCL 4 MG/2ML IJ SOLN
4.0000 mg | Freq: Once | INTRAMUSCULAR | Status: DC
  Filled 2016-06-22: qty 2

## 2016-06-22 MED ORDER — IBUPROFEN 800 MG PO TABS
800.0000 mg | ORAL_TABLET | Freq: Once | ORAL | Status: AC
  Administered 2016-06-22: 800 mg via ORAL
  Filled 2016-06-22: qty 1

## 2016-06-22 NOTE — ED Notes (Signed)
Pt verbalized understanding of d/c instructions and has no further questions. Pt is stable, A&Ox4, VSS.  

## 2016-06-22 NOTE — ED Triage Notes (Signed)
Pt presents to ED with complaint of severe abdominal and flank pain which began Wednesday of last week. She states that she has been taking Motrin and pain has not been resolved. Rating pain 4/10 at this time.

## 2016-06-22 NOTE — ED Triage Notes (Signed)
Emotional support offer by staff, pt emotional, openly talks about her faith.

## 2016-06-22 NOTE — ED Provider Notes (Signed)
[facility] DEPT Provider Note   CSN: [id number] Arrival date & time: 06/22/16  0908     History   Chief Complaint Chief Complaint  Patient presents with  . Abdominal Pain  . Flank Pain    HPI Autumn [name] is a 45 y.o. female.  HPI  Pt presenting with c/o bloating and abdominal pain as well as bilateral flank pain.  Symptoms started 5-6 days.  No vomiting, no fever,  No change in stools.  Denies dysuria.  No blood in urine.  Abdominal pain feels worse as the day progresses and is located in epigastric and periumbilical region.  Aching constant pain.  Bilateral flank pain feels like soreness bilaterally.  No injuries.  There are no other associated systemic symptoms, there are no other alleviating or modifying factors. She has tried ibuprofen without much relief.    Past Medical History:  Diagnosis Date  . Lymphadenopathy of head and neck region 05/03/2015  . Venereal disease 2012   ringing in L ear     Patient Active Problem List   Diagnosis Date Noted  . CLL (chronic lymphocytic leukemia) ([place]) 06/04/2016  . Lymphadenopathy of head and neck region 05/03/2015  . Fibrocystic breast changes 07/05/2012  . PMDD (premenstrual dysphoric disorder) 07/05/2012  . Hx of fibroadenoma of right breast 07/05/2012  . Family hx of Hodgkin's disease 07/05/2012    Past Surgical History:  Procedure Laterality Date  . BREAST SURGERY  1999 or 2000   R breast fibercystic  . CESAREAN SECTION  2007    OB History    Gravida Para Term Preterm AB Living   2 0 0 0 1 1   SAB TAB Ectopic Multiple Live Births   1 0 0 0         Home Medications    Prior to Admission medications   Medication Sig Start Date End Date Taking? Authorizing Provider  acetaminophen (TYLENOL) 325 MG tablet Take 650 mg by mouth every 6 (six) hours as needed for moderate pain or headache.    Yes Historical Provider, MD  cephALEXin (KEFLEX) 500 MG capsule Take 500 mg by mouth 2 (two) times daily. 06/11/16    Historical Provider, MD  ibuprofen (ADVIL,MOTRIN) 800 MG tablet Take 1 tablet (800 mg total) by mouth 3 (three) times daily. 06/22/16   Alfonzo Beers, MD    Family History Family History  Problem Relation Age of Onset  . Hypertension Mother   . Cancer Mother 78    uterine ca  . Hypertension Father   . Heart disease Father   . Hodgkin's lymphoma Sister   . Heart disease Sister   . Cancer Sister     lymphoma  . Diabetes Maternal Grandmother   . Cancer Maternal Grandmother     breast  . Cancer Maternal Grandfather     lymphoma  . Cancer Paternal Grandmother     breast  . Hypertension Paternal Grandfather   . Heart disease Paternal Grandfather     Social History Social History  Substance Use Topics  . Smoking status: Never Smoker  . Smokeless tobacco: Never Used  . Alcohol use No     Allergies   Codeine   Review of Systems Review of Systems  ROS reviewed and all otherwise negative except for mentioned in HPI   Physical Exam Updated Vital Signs BP 139/94 (BP Location: Right Arm)   Pulse 99   Temp 99.2 F (37.3 C) (Oral)   Resp 18   Ht 5'  6" (1.676 m)   Wt 150 lb (68 kg)   LMP 06/07/2016   SpO2 100%   BMI 24.21 kg/m  Vitals reviewed Physical Exam Physical Examination: General appearance - alert, well appearing, and in no distress Mental status - alert, oriented to person, place, and time Eyes -no conjunctival injection, no scleral icterus Neck- palpable lymphadenopathy in left cervical region Chest - clear to auscultation, no wheezes, rales or rhonchi, symmetric air entry Heart - normal rate, regular rhythm, normal S1, S2, no murmurs, rubs, clicks or gallops Abdomen - soft, nontender, nondistended, no masses or organomegaly Neurological - alert, oriented, normal speech Extremities - peripheral pulses normal, no pedal edema, no clubbing or cyanosis Skin - normal coloration and turgor, no rashes  ED Treatments / Results  Labs (all labs ordered are  listed, but only abnormal results are displayed) Labs Reviewed  COMPREHENSIVE METABOLIC PANEL - Abnormal; Notable for the following:       Result Value   Glucose, Bld 108 (*)    Anion gap 4 (*)    All other components within normal limits  CBC - Abnormal; Notable for the following:    Platelets 129 (*)    All other components within normal limits  URINALYSIS, ROUTINE W REFLEX MICROSCOPIC (NOT AT [facility])  LIPASE, BLOOD  I-STAT BETA HCG BLOOD, ED ([facility], [facility], [facility] ONLY)    EKG  EKG Interpretation None       Radiology Ct Abdomen Pelvis W Contrast  Result Date: 06/22/2016 CLINICAL DATA:  Pt presents to ED with complaint of severe abdominal and bilat flank pain which began Wednesday of last week. She states that she has been taking Motrin and pain has not been resolved H/o lymphadenopathy x 1 yr. EXAM: CT ABDOMEN AND PELVIS WITH CONTRAST TECHNIQUE: Multidetector CT imaging of the abdomen and pelvis was performed using the standard protocol following bolus administration of intravenous contrast. CONTRAST:  141mL ISOVUE-300 IOPAMIDOL (ISOVUE-300) INJECTION 61% COMPARISON:  Plain films 06/22/2016 FINDINGS: Lower chest: No pulmonary nodules, pleural effusions, or infiltrates. Heart size is normal. No imaged pericardial effusion or significant coronary artery calcifications. Hepatobiliary: The liver is homogeneous without focal lesion. Gallbladder is present and normal in CT appearance. Pancreas: Normal in appearance. Spleen: Normal in appearance. Renal/Adrenal: Adrenal glands are normal. Kidney show normal bilateral enhancement and excretion. No focal renal mass. Gastrointestinal tract: The stomach is normal in appearance. Small bowel loops are normal in caliber. There is fluid within the right lower quadrant, within the mesentery surrounding right lower quadrant small bowel loops and the appendix. However the appendix appears normal in size. The colonic loops are normal in appearance. Reproductive/Pelvis:  The uterus is present. Bilateral ovarian follicles are present. No suspicious adnexal mass. There is moderate free pelvic fluid. Vascular/Lymphatic: Fall . Retroperitoneal adenopathy and mesenteric adenopathy identified. The nodes are confluent and difficult to measure individual. Increases of the aorta and inferior vena cava, nodal mass at the level of the horizontal portion of the duodenum is 10.0 x 3.4 cm. Left mesenteric mass on image 40 of series 2 is 4.2 x 4.4 cm. Enlarged nodes are identified at the aortic bifurcation and at the external iliac chain. Nodes appear stab normal morphology and size in the inguinal regions bilaterally. No evidence for aortic aneurysm. Musculoskeletal/Abdominal wall: Visualized osseous structures have a normal appearance. Abdominal wall is unremarkable. Other: none IMPRESSION: 1. Bulky retroperitoneal and mesenteric adenopathy, suspicious for lymphoma. Infectious process or metastases could have a similar appearance but are less likely. 2.  There is fluid within the abdomen, mesentery, and pelvis. Fluid surrounding the appendix is likely secondary to ascites rather than appendicitis. 3. No evidence for bowel obstruction come urinary tract obstruction. Electronically Signed   By: Nolon Nations M.D.   On: 06/22/2016 12:47   Dg Abd Acute W/chest  Result Date: 06/22/2016 CLINICAL DATA:  Abdominal and bilateral flank pain for 4 days. EXAM: DG ABDOMEN ACUTE W/ 1V CHEST COMPARISON:  10/14/2012 chest radiographs FINDINGS: The cardiomediastinal silhouette is unremarkable. The lungs are clear. There is no evidence of pneumothorax or pleural effusion. The bowel gas pattern is unremarkable. No dilated bowel loops or pneumoperitoneum identified. No suspicious calcifications are present. No bony abnormalities are noted. IMPRESSION: Negative abdominal radiographs.  No acute cardiopulmonary disease. Electronically Signed   By: Margarette Canada M.D.   On: 06/22/2016 10:30     Procedures Procedures (including critical care time)  Medications Ordered in ED Medications  iopamidol (ISOVUE-300) 61 % injection 100 mL (100 mLs Intravenous Contrast Given 06/22/16 1220)  ibuprofen (ADVIL,MOTRIN) tablet 800 mg (800 mg Oral Given 06/22/16 1335)     Initial Impression / Assessment and Plan / ED Course  I have reviewed the triage vital signs and the nursing notes.  Pertinent labs & imaging results that were available during my care of the patient were reviewed by me and considered in my medical decision making (see chart for details).  Clinical Course    Pt presenting with abdominal pain and back pain, LAD of left neck which has been present for months.  Her labs are reassuring but she has ongoing pain- CT scan shows likely lymphoma in abdominal and retroperitoneal regions. I have d/w oncology so she will get close followup/workup.  I have discussed all results with patient.  She is tearful, offered support, she has notified her husband and mother.  Discharged with strict return precautions.  Pt agreeable with plan.  Final Clinical Impressions(s) / ED Diagnoses   Final diagnoses:  Lymphadenopathy, abdominal    New Prescriptions Discharge Medication List as of 06/22/2016  2:18 PM       Alfonzo Beers, MD 06/22/16 1649

## 2016-06-22 NOTE — Discharge Instructions (Signed)
Return to the ED with any concerns including vomiting, difficulty breathing, fainting, or any other alarming symptoms

## 2016-06-23 ENCOUNTER — Encounter: Payer: Self-pay | Admitting: Hematology and Oncology

## 2016-06-23 ENCOUNTER — Ambulatory Visit (HOSPITAL_BASED_OUTPATIENT_CLINIC_OR_DEPARTMENT_OTHER): Payer: Managed Care, Other (non HMO) | Admitting: Hematology and Oncology

## 2016-06-23 ENCOUNTER — Other Ambulatory Visit: Payer: Self-pay | Admitting: Hematology

## 2016-06-23 ENCOUNTER — Telehealth: Payer: Self-pay | Admitting: *Deleted

## 2016-06-23 DIAGNOSIS — G893 Neoplasm related pain (acute) (chronic): Secondary | ICD-10-CM | POA: Diagnosis not present

## 2016-06-23 DIAGNOSIS — R110 Nausea: Secondary | ICD-10-CM

## 2016-06-23 DIAGNOSIS — F411 Generalized anxiety disorder: Secondary | ICD-10-CM

## 2016-06-23 DIAGNOSIS — R599 Enlarged lymph nodes, unspecified: Secondary | ICD-10-CM | POA: Diagnosis not present

## 2016-06-23 DIAGNOSIS — R590 Localized enlarged lymph nodes: Secondary | ICD-10-CM

## 2016-06-23 MED ORDER — LORAZEPAM 0.5 MG PO TABS: 0.5000 mg | ORAL_TABLET | Freq: Three times a day (TID) | ORAL | 0 refills | Status: DC

## 2016-06-23 MED ORDER — PROMETHAZINE HCL 25 MG PO TABS: 25.0000 mg | ORAL_TABLET | Freq: Four times a day (QID) | ORAL | 1 refills | Status: AC | PRN

## 2016-06-23 MED ORDER — HYDROMORPHONE HCL 2 MG PO TABS: 2.0000 mg | ORAL_TABLET | ORAL | 0 refills | Status: DC | PRN

## 2016-06-23 NOTE — Assessment & Plan Note (Signed)
She had some nausea without vomiting. I give her prescription for Phenergan to take as needed

## 2016-06-23 NOTE — Assessment & Plan Note (Signed)
Her overall clinical presentation is highly suspicious for undiagnosed lymphoma. I will discuss with her ENT surgeon tomorrow for excisional lymph node biopsy of the head and neck region.  On the CT abdomen, the retroperitoneal lymphadenopathy is too deep to be assessable easily without risk of damaging surrounding structures. I will see her back as soon as biopsy is obtained

## 2016-06-23 NOTE — Assessment & Plan Note (Signed)
She has mid to lower back pain likely related to the cancer noted on CT. We discussed treatment options. For now, she is comfortable taking ibuprofen. However, I do recommend she fill a prescription of Dilaudid to take as needed if her pain is severe. We discussed risk of sedation, nausea and constipation with narcotic prescription With her severe pain, she is not able to work. 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

## 2016-06-23 NOTE — Progress Notes (Signed)
[facility] OFFICE PROGRESS NOTE  Patient Care Team: Helane Rima, MD as PCP - General (Family Medicine) Vicie Mutters, MD as Consulting Physician (Otolaryngology)  SUMMARY OF ONCOLOGIC HISTORY:  Autumn [name] is seen here because of bilateral palpable lymphadenopathy in the neck region. This patient has strong family history of lymphoma in her sister. She was referred to see me because of self palpated enlarging lymphadenopathy in the left side of the neck. She denies any tenderness. Denies any recent infection in the head and neck region. She thought she might have felt a lymph node in the right groin as well. CT scan of the neck on 05/09/2015 show small reactive lymphadenopathy. Screening blood tests for autoimmune diseases are negative and she was placed on observation CT scan of the abdomen dated 06/22/2016 show lymphadenopathy, suspicious for lymphoma INTERVAL HISTORY: Please see below for problem oriented charting. She calls me for urgent evaluation because of significant anxiety related to her recent new symptoms. She is due to go back to see ENT in 2 days. She has an appointment recently and was prescribed a course of antibiotic for lymphadenopathy. She has severe intermittent lower back pain and went to the emergency department yesterday. CT scan showed diffuse lymphadenopathy suspicious for lymphoma. She was prescribed Motrin and was released from the emergency department. She is accompanied by her husband today and appears very nervous. She has poor sleep. She had recent headaches. She had recent nausea. Denies changes in bowel habits. She is anxious and tearful but denies depression.  REVIEW OF SYSTEMS:   Constitutional: Denies fevers, chills or abnormal weight loss Eyes: Denies blurriness of vision Ears, nose, mouth, throat, and face: Denies mucositis or sore throat Respiratory: Denies cough, dyspnea or wheezes Cardiovascular: Denies palpitation,  chest discomfort or lower extremity swelling Skin: Denies abnormal skin rashes Lymphatics: Denies new lymphadenopathy or easy bruising Neurological:Denies numbness, tingling or new weaknesses All other systems were reviewed with the patient and are negative.  I have reviewed the past medical history, past surgical history, social history and family history with the patient and they are unchanged from previous note.  ALLERGIES:  is allergic to codeine.  MEDICATIONS:  Current Outpatient Prescriptions  Medication Sig Dispense Refill  . acetaminophen (TYLENOL) 325 MG tablet Take 650 mg by mouth every 6 (six) hours as needed for moderate pain or headache.     . cephALEXin (KEFLEX) 500 MG capsule Take 500 mg by mouth 2 (two) times daily.    Marland Kitchen HYDROmorphone (DILAUDID) 2 MG tablet Take 1 tablet (2 mg total) by mouth every 4 (four) hours as needed for severe pain. 30 tablet 0  . ibuprofen (ADVIL,MOTRIN) 800 MG tablet Take 1 tablet (800 mg total) by mouth 3 (three) times daily. 21 tablet 0  . LORazepam (ATIVAN) 0.5 MG tablet Take 1 tablet (0.5 mg total) by mouth every 8 (eight) hours. 30 tablet 0  . promethazine (PHENERGAN) 25 MG tablet Take 1 tablet (25 mg total) by mouth every 6 (six) hours as needed for nausea or vomiting. 60 tablet 1   No current facility-administered medications for this visit.     PHYSICAL EXAMINATION: ECOG PERFORMANCE STATUS: 1 - Symptomatic but completely ambulatory  Vitals:   06/23/16 1328  BP: 130/88  Pulse: 96  Resp: 19  Temp: 99.1 F (37.3 C)   Filed Weights   06/23/16 1328  Weight: 147 lb 11.2 oz (67 kg)    GENERAL:alert, no distress and comfortable SKIN: skin color, texture, turgor  are normal, no rashes or significant lesions EYES: normal, Conjunctiva are pink and non-injected, sclera clear Musculoskeletal:no cyanosis of digits and no clubbing  NEURO: alert & oriented x 3 with fluent speech, no focal motor/sensory deficits  LABORATORY DATA:  I have  reviewed the data as listed    Component Value Date/Time   NA 139 06/22/2016 0938   NA 141 06/05/2016 1443   K 3.6 06/22/2016 0938   K 3.8 06/05/2016 1443   CL 108 06/22/2016 0938   CO2 27 06/22/2016 0938   CO2 27 06/05/2016 1443   GLUCOSE 108 (H) 06/22/2016 0938   GLUCOSE 94 06/05/2016 1443   BUN 9 06/22/2016 0938   BUN 13.7 06/05/2016 1443   CREATININE 0.69 06/22/2016 0938   CREATININE 0.8 06/05/2016 1443   CALCIUM 9.3 06/22/2016 0938   CALCIUM 9.1 06/05/2016 1443   PROT 7.1 06/22/2016 0938   PROT 6.8 06/05/2016 1443   ALBUMIN 4.2 06/22/2016 0938   ALBUMIN 3.9 06/05/2016 1443   AST 26 06/22/2016 0938   AST 19 06/05/2016 1443   ALT 20 06/22/2016 0938   ALT 14 06/05/2016 1443   ALKPHOS 62 06/22/2016 0938   ALKPHOS 65 06/05/2016 1443   BILITOT 0.7 06/22/2016 0938   BILITOT 0.67 06/05/2016 1443   GFRNONAA >60 06/22/2016 0938   GFRAA >60 06/22/2016 0938    No results found for: SPEP, UPEP  Lab Results  Component Value Date   WBC 6.6 06/22/2016   NEUTROABS 3.1 06/05/2016   HGB 12.7 06/22/2016   HCT 36.3 06/22/2016   MCV 85.2 06/22/2016   PLT 129 (L) 06/22/2016      Chemistry      Component Value Date/Time   NA 139 06/22/2016 0938   NA 141 06/05/2016 1443   K 3.6 06/22/2016 0938   K 3.8 06/05/2016 1443   CL 108 06/22/2016 0938   CO2 27 06/22/2016 0938   CO2 27 06/05/2016 1443   BUN 9 06/22/2016 0938   BUN 13.7 06/05/2016 1443   CREATININE 0.69 06/22/2016 0938   CREATININE 0.8 06/05/2016 1443      Component Value Date/Time   CALCIUM 9.3 06/22/2016 0938   CALCIUM 9.1 06/05/2016 1443   ALKPHOS 62 06/22/2016 0938   ALKPHOS 65 06/05/2016 1443   AST 26 06/22/2016 0938   AST 19 06/05/2016 1443   ALT 20 06/22/2016 0938   ALT 14 06/05/2016 1443   BILITOT 0.7 06/22/2016 0938   BILITOT 0.67 06/05/2016 1443       RADIOGRAPHIC STUDIES: I have personally reviewed the radiological images as listed and agreed with the findings in the report. Ct Abdomen Pelvis  W Contrast  Result Date: 06/22/2016 CLINICAL DATA:  Pt presents to ED with complaint of severe abdominal and bilat flank pain which began Wednesday of last week. She states that she has been taking Motrin and pain has not been resolved H/o lymphadenopathy x 1 yr. EXAM: CT ABDOMEN AND PELVIS WITH CONTRAST TECHNIQUE: Multidetector CT imaging of the abdomen and pelvis was performed using the standard protocol following bolus administration of intravenous contrast. CONTRAST:  139mL ISOVUE-300 IOPAMIDOL (ISOVUE-300) INJECTION 61% COMPARISON:  Plain films 06/22/2016 FINDINGS: Lower chest: No pulmonary nodules, pleural effusions, or infiltrates. Heart size is normal. No imaged pericardial effusion or significant coronary artery calcifications. Hepatobiliary: The liver is homogeneous without focal lesion. Gallbladder is present and normal in CT appearance. Pancreas: Normal in appearance. Spleen: Normal in appearance. Renal/Adrenal: Adrenal glands are normal. Kidney show normal bilateral enhancement and  excretion. No focal renal mass. Gastrointestinal tract: The stomach is normal in appearance. Small bowel loops are normal in caliber. There is fluid within the right lower quadrant, within the mesentery surrounding right lower quadrant small bowel loops and the appendix. However the appendix appears normal in size. The colonic loops are normal in appearance. Reproductive/Pelvis: The uterus is present. Bilateral ovarian follicles are present. No suspicious adnexal mass. There is moderate free pelvic fluid. Vascular/Lymphatic: Fall . Retroperitoneal adenopathy and mesenteric adenopathy identified. The nodes are confluent and difficult to measure individual. Increases of the aorta and inferior vena cava, nodal mass at the level of the horizontal portion of the duodenum is 10.0 x 3.4 cm. Left mesenteric mass on image 40 of series 2 is 4.2 x 4.4 cm. Enlarged nodes are identified at the aortic bifurcation and at the external iliac  chain. Nodes appear stab normal morphology and size in the inguinal regions bilaterally. No evidence for aortic aneurysm. Musculoskeletal/Abdominal wall: Visualized osseous structures have a normal appearance. Abdominal wall is unremarkable. Other: none IMPRESSION: 1. Bulky retroperitoneal and mesenteric adenopathy, suspicious for lymphoma. Infectious process or metastases could have a similar appearance but are less likely. 2. There is fluid within the abdomen, mesentery, and pelvis. Fluid surrounding the appendix is likely secondary to ascites rather than appendicitis. 3. No evidence for bowel obstruction come urinary tract obstruction. Electronically Signed   By: Nolon Nations M.D.   On: 06/22/2016 12:47   Dg Abd Acute W/chest  Result Date: 06/22/2016 CLINICAL DATA:  Abdominal and bilateral flank pain for 4 days. EXAM: DG ABDOMEN ACUTE W/ 1V CHEST COMPARISON:  10/14/2012 chest radiographs FINDINGS: The cardiomediastinal silhouette is unremarkable. The lungs are clear. There is no evidence of pneumothorax or pleural effusion. The bowel gas pattern is unremarkable. No dilated bowel loops or pneumoperitoneum identified. No suspicious calcifications are present. No bony abnormalities are noted. IMPRESSION: Negative abdominal radiographs.  No acute cardiopulmonary disease. Electronically Signed   By: Margarette Canada M.D.   On: 06/22/2016 10:30     ASSESSMENT & PLAN:  Lymphadenopathy of head and neck region Her overall clinical presentation is highly suspicious for undiagnosed lymphoma. I will discuss with her ENT surgeon tomorrow for excisional lymph node biopsy of the head and neck region.  On the CT abdomen, the retroperitoneal lymphadenopathy is too deep to be assessable easily without risk of damaging surrounding structures. I will see her back as soon as biopsy is obtained  Cancer associated pain She has mid to lower back pain likely related to the cancer noted on CT. We discussed treatment  options. For now, she is comfortable taking ibuprofen. However, I do recommend she fill a prescription of Dilaudid to take as needed if her pain is severe. We discussed risk of sedation, nausea and constipation with narcotic prescription With her severe pain, she is not able to work. I gave her a letter to give to her manager so that she can be off work for the next 2 weeks  Nausea without vomiting She had some nausea without vomiting. I give her prescription for Phenergan to take as needed  Generalized anxiety disorder She has generalized anxiety, understandably due to recent possible diagnosis of lymphoma. She is very tearful and nervous. I recommend a short course prescription lorazepam to take as needed. I want her risk of sedation and side effects due to potential interactions with other medications prescribed today. She is aware.   No orders of the defined types were placed in this  encounter.  All questions were answered. The patient knows to call the clinic with any problems, questions or concerns. No barriers to learning was detected. I spent 25 minutes counseling the patient face to face. The total time spent in the appointment was 30 minutes and more than 50% was on counseling and review of test results     [facility], [place], MD 06/23/2016 3:38 PM

## 2016-06-23 NOTE — Assessment & Plan Note (Signed)
She has generalized anxiety, understandably due to recent possible diagnosis of lymphoma. She is very tearful and nervous. I recommend a short course prescription lorazepam to take as needed. I want her risk of sedation and side effects due to potential interactions with other medications prescribed today. She is aware.

## 2016-06-23 NOTE — Telephone Encounter (Signed)
Pt will be here at 1:00pm today

## 2016-06-23 NOTE — Telephone Encounter (Signed)
"  I need an appointment to see Dr. Alvy Bimler today.  Call me at [phone].  I went to ED yesterday due to pain and learned I have nodes in my abdomen suspicious for lymphoma.  I have a lot af abdominal and left hip pain.  I'm taking ibuprofen with some relief.  I have so much anxiety and trying to keep it together for my two sins in the home."  Will notify Dr Alvy Bimler.  Very tearful with this conversation.

## 2016-06-23 NOTE — Telephone Encounter (Signed)
Pls add her on to be seen at 1 pm today, 30 mins

## 2016-06-26 ENCOUNTER — Telehealth: Payer: Self-pay | Admitting: *Deleted

## 2016-06-26 ENCOUNTER — Encounter (HOSPITAL_BASED_OUTPATIENT_CLINIC_OR_DEPARTMENT_OTHER): Payer: Self-pay | Admitting: *Deleted

## 2016-06-26 NOTE — Telephone Encounter (Signed)
Pt notified of appt on 9/15 @ 1115 with Dr Alvy Bimler.

## 2016-06-26 NOTE — Telephone Encounter (Signed)
Pt called to let Dr Alvy Bimler know her biopsy is scheduled with Dr Lucia Gaskins is scheduled for Tuesday,  9/12 @ 0830.  Also wants her to know her abdominal girth is growing daily. Has been using dilaudid for pain. Wants to know if this is normal?

## 2016-06-26 NOTE — Telephone Encounter (Signed)
Tammi, please place scheduling order to see me Fri 9/15 at 1115 am, 30 mins

## 2016-06-29 ENCOUNTER — Ambulatory Visit: Payer: Self-pay | Admitting: Otolaryngology

## 2016-06-29 ENCOUNTER — Other Ambulatory Visit: Payer: Self-pay | Admitting: Otolaryngology

## 2016-06-29 NOTE — H&P (Signed)
PREOPERATIVE H&P  Chief Complaint: enlarged left neck nodes  HPI: Autumn [name] is a 45 y.o. female who presents for evaluation of enlarged left neck nodes suspicious for lymphoma. She has a sister with a history of Hodgkin's disease. She's taken to the OR for excisional biopsy of left neck node.  Past Medical History:  Diagnosis Date  . Anxiety   . Complication of anesthesia   . Lymphadenopathy of head and neck region 05/03/2015  . PONV (postoperative nausea and vomiting)   . 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 BREAST SURGERY  1999 or 2000   R breast fibercystic  . CESAREAN SECTION  2007   Social History   Social History  . Marital status: Married    Spouse name: N/A  . Number of children: N/A  . Years of education: N/A   Social History Main Topics  . Smoking status: Never Smoker  . Smokeless tobacco: Never Used  . Alcohol use No  . Drug use: No  . Sexual activity: Yes    Partners: Male    Birth control/ protection: Condom     Comment: nurse   Other Topics Concern  . None   Social History Narrative  . None   Family History  Problem Relation Age of Onset  . Hypertension Mother   . Cancer Mother 58    uterine ca  . Hypertension Father   . Heart disease Father   . Hodgkin's lymphoma Sister   . Heart disease Sister   . Cancer Sister     lymphoma  . Diabetes Maternal Grandmother   . Cancer Maternal Grandmother     breast  . Cancer Maternal Grandfather     lymphoma  . Cancer Paternal Grandmother     breast  . Hypertension Paternal Grandfather   . Heart disease Paternal Grandfather    Allergies  Allergen Reactions  . Codeine Nausea And Vomiting   Prior to Admission medications   Medication Sig Start Date End Date Taking? Authorizing Provider  HYDROmorphone (DILAUDID) 2 MG tablet Take 1 tablet (2 mg total) by mouth every 4 (four) hours as needed for severe pain. 06/23/16  Yes Heath Lark,  MD  ibuprofen (ADVIL,MOTRIN) 800 MG tablet Take 1 tablet (800 mg total) by mouth 3 (three) times daily. 06/22/16  Yes Alfonzo Beers, MD  LORazepam (ATIVAN) 0.5 MG tablet Take 1 tablet (0.5 mg total) by mouth every 8 (eight) hours. 06/23/16  Yes Heath Lark, MD  acetaminophen (TYLENOL) 325 MG tablet Take 650 mg by mouth every 6 (six) hours as needed for moderate pain or headache.     Historical Provider, MD  promethazine (PHENERGAN) 25 MG tablet Take 1 tablet (25 mg total) by mouth every 6 (six) hours as needed for nausea or vomiting. 06/23/16   Heath Lark, MD     Positive ROS: per HPI  All other systems have been reviewed and were otherwise negative with the exception of those mentioned in the HPI and as above.  Physical Exam: There were no vitals filed for this visit.  General: Alert, no acute distress Oral: Normal oral mucosa and tonsils. IDL clear Nasal: Clear nasal passages Neck: 2 enlarged left neck nodes, 1 anterior to SCM and 1 posterior to SCM Ear: Ear canal is clear with normal appearing TMs Cardiovascular: Regular rate and rhythm, no murmur.  Respiratory: Clear to auscultation Neurologic: Alert and oriented x 3  Assessment/Plan: ENLARGED LEFT NECK LYMPH NODE Plan for Procedure(s): LEFT NECK LYMPH NODE BIOPSY   Melony Overly, MD 06/29/2016 10:41 AM

## 2016-06-30 ENCOUNTER — Encounter (HOSPITAL_BASED_OUTPATIENT_CLINIC_OR_DEPARTMENT_OTHER): Payer: Self-pay | Admitting: *Deleted

## 2016-06-30 ENCOUNTER — Ambulatory Visit (HOSPITAL_BASED_OUTPATIENT_CLINIC_OR_DEPARTMENT_OTHER): Payer: Managed Care, Other (non HMO) | Admitting: Anesthesiology

## 2016-06-30 ENCOUNTER — Ambulatory Visit (HOSPITAL_BASED_OUTPATIENT_CLINIC_OR_DEPARTMENT_OTHER)
Admission: RE | Admit: 2016-06-30 | Discharge: 2016-06-30 | Disposition: A | Discharge status: Home / Self Care | Payer: Managed Care, Other (non HMO) | Source: Ambulatory Visit | Attending: Otolaryngology | Admitting: Otolaryngology

## 2016-06-30 ENCOUNTER — Encounter (HOSPITAL_BASED_OUTPATIENT_CLINIC_OR_DEPARTMENT_OTHER)
Admission: RE | Disposition: A | Discharge status: Home / Self Care | Payer: Self-pay | Source: Ambulatory Visit | Attending: Otolaryngology

## 2016-06-30 DIAGNOSIS — F329 Major depressive disorder, single episode, unspecified: Secondary | ICD-10-CM | POA: Diagnosis not present

## 2016-06-30 DIAGNOSIS — F419 Anxiety disorder, unspecified: Secondary | ICD-10-CM | POA: Diagnosis not present

## 2016-06-30 DIAGNOSIS — C8331 Diffuse large B-cell lymphoma, lymph nodes of head, face, and neck: Secondary | ICD-10-CM | POA: Diagnosis not present

## 2016-06-30 DIAGNOSIS — R590 Localized enlarged lymph nodes: Secondary | ICD-10-CM | POA: Diagnosis present

## 2016-06-30 HISTORY — DX: Other specified postprocedural states: Z98.890

## 2016-06-30 HISTORY — DX: Nausea with vomiting, unspecified: R11.2

## 2016-06-30 HISTORY — DX: Anxiety disorder, unspecified: F41.9

## 2016-06-30 HISTORY — DX: Other complications of anesthesia, initial encounter: T88.59XA

## 2016-06-30 HISTORY — DX: Adverse effect of unspecified anesthetic, initial encounter: T41.45XA

## 2016-06-30 HISTORY — PX: LYMPH NODE BIOPSY: SHX201

## 2016-06-30 SURGERY — LYMPH NODE BIOPSY
Anesthesia: General | Site: Neck | Laterality: Left

## 2016-06-30 MED ORDER — HYDROCODONE-ACETAMINOPHEN 5-325 MG PO TABS: 1.0000 | ORAL_TABLET | Freq: Four times a day (QID) | ORAL | 0 refills | Status: DC | PRN

## 2016-06-30 MED ORDER — ONDANSETRON HCL 4 MG/2ML IJ SOLN
INTRAMUSCULAR | Status: AC
  Filled 2016-06-30: qty 2

## 2016-06-30 MED ORDER — EPHEDRINE 5 MG/ML INJ
INTRAVENOUS | Status: AC
  Filled 2016-06-30: qty 10

## 2016-06-30 MED ORDER — MIDAZOLAM HCL 2 MG/2ML IJ SOLN
1.0000 mg | INTRAMUSCULAR | Status: DC | PRN
  Administered 2016-06-30: 2 mg via INTRAVENOUS
  Administered 2016-06-30: 1 mg via INTRAVENOUS

## 2016-06-30 MED ORDER — SCOPOLAMINE 1 MG/3DAYS TD PT72
1.0000 | MEDICATED_PATCH | Freq: Once | TRANSDERMAL | Status: DC | PRN
  Administered 2016-06-30: 1.5 mg via TRANSDERMAL

## 2016-06-30 MED ORDER — GLYCOPYRROLATE 0.2 MG/ML IJ SOLN: 0.2000 mg | Freq: Once | INTRAMUSCULAR | Status: DC | PRN

## 2016-06-30 MED ORDER — KETOROLAC TROMETHAMINE 30 MG/ML IJ SOLN
INTRAMUSCULAR | Status: AC
  Filled 2016-06-30: qty 1

## 2016-06-30 MED ORDER — PROPOFOL 10 MG/ML IV BOLUS
INTRAVENOUS | Status: DC | PRN
  Administered 2016-06-30: 150 mg via INTRAVENOUS
  Administered 2016-06-30: 50 mg via INTRAVENOUS

## 2016-06-30 MED ORDER — LIDOCAINE HCL (CARDIAC) 20 MG/ML IV SOLN
INTRAVENOUS | Status: DC | PRN
  Administered 2016-06-30: 100 mg via INTRAVENOUS

## 2016-06-30 MED ORDER — DEXAMETHASONE SODIUM PHOSPHATE 4 MG/ML IJ SOLN
INTRAMUSCULAR | Status: DC | PRN
  Administered 2016-06-30: 10 mg via INTRAVENOUS

## 2016-06-30 MED ORDER — DEXAMETHASONE SODIUM PHOSPHATE 10 MG/ML IJ SOLN
INTRAMUSCULAR | Status: AC
  Filled 2016-06-30: qty 1

## 2016-06-30 MED ORDER — PHENYLEPHRINE HCL 10 MG/ML IJ SOLN
INTRAMUSCULAR | Status: DC | PRN
  Administered 2016-06-30: 40 ug/min via INTRAVENOUS

## 2016-06-30 MED ORDER — SUCCINYLCHOLINE CHLORIDE 20 MG/ML IJ SOLN
INTRAMUSCULAR | Status: DC | PRN
  Administered 2016-06-30: 100 mg via INTRAVENOUS

## 2016-06-30 MED ORDER — LIDOCAINE 2% (20 MG/ML) 5 ML SYRINGE
INTRAMUSCULAR | Status: AC
  Filled 2016-06-30: qty 5

## 2016-06-30 MED ORDER — FENTANYL CITRATE (PF) 100 MCG/2ML IJ SOLN
INTRAMUSCULAR | Status: AC
  Filled 2016-06-30: qty 2

## 2016-06-30 MED ORDER — PHENYLEPHRINE HCL 10 MG/ML IJ SOLN
INTRAMUSCULAR | Status: DC | PRN
  Administered 2016-06-30 (×2): 80 ug via INTRAVENOUS

## 2016-06-30 MED ORDER — SUCCINYLCHOLINE CHLORIDE 200 MG/10ML IV SOSY
PREFILLED_SYRINGE | INTRAVENOUS | Status: AC
  Filled 2016-06-30: qty 10

## 2016-06-30 MED ORDER — KETOROLAC TROMETHAMINE 15 MG/ML IJ SOLN
15.0000 mg | Freq: Once | INTRAMUSCULAR | Status: AC
  Administered 2016-06-30: 15 mg via INTRAVENOUS

## 2016-06-30 MED ORDER — PHENYLEPHRINE HCL 10 MG/ML IJ SOLN
INTRAMUSCULAR | Status: AC
  Filled 2016-06-30: qty 1

## 2016-06-30 MED ORDER — CEFAZOLIN SODIUM-DEXTROSE 2-4 GM/100ML-% IV SOLN
2.0000 g | INTRAVENOUS | Status: AC
  Administered 2016-06-30: 2 g via INTRAVENOUS

## 2016-06-30 MED ORDER — CEFAZOLIN SODIUM-DEXTROSE 2-4 GM/100ML-% IV SOLN
INTRAVENOUS | Status: AC
  Filled 2016-06-30: qty 100

## 2016-06-30 MED ORDER — MIDAZOLAM HCL 2 MG/2ML IJ SOLN
INTRAMUSCULAR | Status: AC
  Filled 2016-06-30: qty 2

## 2016-06-30 MED ORDER — SCOPOLAMINE 1 MG/3DAYS TD PT72
MEDICATED_PATCH | TRANSDERMAL | Status: AC
  Filled 2016-06-30: qty 1

## 2016-06-30 MED ORDER — FENTANYL CITRATE (PF) 100 MCG/2ML IJ SOLN
50.0000 ug | INTRAMUSCULAR | Status: AC | PRN
  Administered 2016-06-30: 25 ug via INTRAVENOUS
  Administered 2016-06-30: 100 ug via INTRAVENOUS
  Administered 2016-06-30: 50 ug via INTRAVENOUS
  Administered 2016-06-30: 25 ug via INTRAVENOUS

## 2016-06-30 MED ORDER — PHENYLEPHRINE 40 MCG/ML (10ML) SYRINGE FOR IV PUSH (FOR BLOOD PRESSURE SUPPORT)
PREFILLED_SYRINGE | INTRAVENOUS | Status: AC
  Filled 2016-06-30: qty 10

## 2016-06-30 MED ORDER — LIDOCAINE-EPINEPHRINE 1 %-1:100000 IJ SOLN
INTRAMUSCULAR | Status: DC | PRN
  Administered 2016-06-30: 2 mL

## 2016-06-30 MED ORDER — ONDANSETRON HCL 4 MG/2ML IJ SOLN
INTRAMUSCULAR | Status: DC | PRN
  Administered 2016-06-30 (×2): 4 mg via INTRAVENOUS

## 2016-06-30 MED ORDER — ARTIFICIAL TEARS OP OINT
TOPICAL_OINTMENT | OPHTHALMIC | Status: AC
  Filled 2016-06-30: qty 3.5

## 2016-06-30 MED ORDER — LACTATED RINGERS IV SOLN
INTRAVENOUS | Status: DC
  Administered 2016-06-30: 10 mL/h via INTRAVENOUS
  Administered 2016-06-30 (×2): via INTRAVENOUS

## 2016-06-30 SURGICAL SUPPLY — 53 items
BENZOIN TINCTURE PRP APPL 2/3 (GAUZE/BANDAGES/DRESSINGS) IMPLANT
BLADE SURG 15 STRL LF DISP TIS (BLADE) ×1 IMPLANT
BLADE SURG 15 STRL SS (BLADE) ×2
CANISTER SUCT 1200ML W/VALVE (MISCELLANEOUS) ×3 IMPLANT
CLEANER CAUTERY TIP 5X5 PAD (MISCELLANEOUS) IMPLANT
CLOSURE WOUND 1/2 X4 (GAUZE/BANDAGES/DRESSINGS)
CLOSURE WOUND 1/4X4 (GAUZE/BANDAGES/DRESSINGS)
CORDS BIPOLAR (ELECTRODE) ×3 IMPLANT
COVER BACK TABLE 60X90IN (DRAPES) ×3 IMPLANT
COVER MAYO STAND STRL (DRAPES) ×3 IMPLANT
DECANTER SPIKE VIAL GLASS SM (MISCELLANEOUS) IMPLANT
DRAPE U-SHAPE 76X120 STRL (DRAPES) ×3 IMPLANT
ELECT COATED BLADE 2.86 ST (ELECTRODE) ×3 IMPLANT
ELECT REM PT RETURN 9FT ADLT (ELECTROSURGICAL) ×3
ELECTRODE REM PT RTRN 9FT ADLT (ELECTROSURGICAL) ×1 IMPLANT
GAUZE SPONGE 4X4 16PLY XRAY LF (GAUZE/BANDAGES/DRESSINGS) IMPLANT
GLOVE SS BIOGEL STRL SZ 7.5 (GLOVE) ×1 IMPLANT
GLOVE SUPERSENSE BIOGEL SZ 7.5 (GLOVE) ×2
GOWN STRL REUS W/ TWL LRG LVL3 (GOWN DISPOSABLE) ×1 IMPLANT
GOWN STRL REUS W/TWL LRG LVL3 (GOWN DISPOSABLE) ×2
HEMOSTAT SURGICEL .5X2 ABSORB (HEMOSTASIS) IMPLANT
LIQUID BAND (GAUZE/BANDAGES/DRESSINGS) IMPLANT
NEEDLE HYPO 25X1 1.5 SAFETY (NEEDLE) ×3 IMPLANT
NS IRRIG 1000ML POUR BTL (IV SOLUTION) ×3 IMPLANT
PACK BASIN DAY SURGERY FS (CUSTOM PROCEDURE TRAY) ×3 IMPLANT
PAD CLEANER CAUTERY TIP 5X5 (MISCELLANEOUS)
PENCIL BUTTON HOLSTER BLD 10FT (ELECTRODE) ×3 IMPLANT
SPONGE GAUZE 4X4 12PLY STER LF (GAUZE/BANDAGES/DRESSINGS) IMPLANT
SPONGE INTESTINAL PEANUT (DISPOSABLE) IMPLANT
STRIP CLOSURE SKIN 1/2X4 (GAUZE/BANDAGES/DRESSINGS) IMPLANT
STRIP CLOSURE SKIN 1/4X4 (GAUZE/BANDAGES/DRESSINGS) IMPLANT
SUCTION FRAZIER HANDLE 10FR (MISCELLANEOUS)
SUCTION TUBE FRAZIER 10FR DISP (MISCELLANEOUS) IMPLANT
SUT CHROMIC 3 0 PS 2 (SUTURE) ×3 IMPLANT
SUT CHROMIC 3 0 SH 27 (SUTURE) IMPLANT
SUT ETHILON 4 0 PS 2 18 (SUTURE) IMPLANT
SUT ETHILON 5 0 P 3 18 (SUTURE) ×2
SUT NYLON ETHILON 5-0 P-3 1X18 (SUTURE) ×1 IMPLANT
SUT SILK 2 0 TIES 17X18 (SUTURE)
SUT SILK 2-0 18XBRD TIE BLK (SUTURE) IMPLANT
SUT SILK 3 0 SH 30 (SUTURE) IMPLANT
SUT SILK 3 0 TIES 17X18 (SUTURE) ×2
SUT SILK 3-0 18XBRD TIE BLK (SUTURE) ×1 IMPLANT
SUT VIC AB 5-0 P-3 18X BRD (SUTURE) IMPLANT
SUT VIC AB 5-0 P3 18 (SUTURE)
SWAB COLLECTION DEVICE MRSA (MISCELLANEOUS) IMPLANT
SWAB CULTURE ESWAB REG 1ML (MISCELLANEOUS) IMPLANT
SYR BULB 3OZ (MISCELLANEOUS) ×3 IMPLANT
SYR CONTROL 10ML LL (SYRINGE) ×3 IMPLANT
TOWEL OR 17X24 6PK STRL BLUE (TOWEL DISPOSABLE) ×6 IMPLANT
TRAY DSU PREP LF (CUSTOM PROCEDURE TRAY) ×3 IMPLANT
TUBE CONNECTING 20'X1/4 (TUBING) ×1
TUBE CONNECTING 20X1/4 (TUBING) ×2 IMPLANT

## 2016-06-30 NOTE — Anesthesia Procedure Notes (Signed)
Procedure Name: Intubation Date/Time: 06/30/2016 8:56 AM Performed by: Melynda Ripple D Pre-anesthesia Checklist: Patient identified, Emergency Drugs available, Suction available and Patient being monitored Patient Re-evaluated:Patient Re-evaluated prior to inductionOxygen Delivery Method: Circle system utilized Preoxygenation: Pre-oxygenation with 100% oxygen Intubation Type: IV induction Ventilation: Mask ventilation without difficulty Laryngoscope Size: Mac and 3 Grade View: Grade I Tube type: Oral Tube size: 7.0 mm Number of attempts: 1 Airway Equipment and Method: Stylet and Oral airway Placement Confirmation: ETT inserted through vocal cords under direct vision,  positive ETCO2 and breath sounds checked- equal and bilateral Secured at: 22 cm Tube secured with: Tape Dental Injury: Teeth and Oropharynx as per pre-operative assessment

## 2016-06-30 NOTE — Interval H&P Note (Signed)
History and Physical Interval Note:  06/30/2016 7:27 AM  Autumn [name]  has presented today for surgery, with the diagnosis of ENLARGED LEFT NECK LYMPH NODE  The various methods of treatment have been discussed with the patient and family. After consideration of risks, benefits and other options for treatment, the patient has consented to  Procedure(s): LEFT NECK LYMPH NODE BIOPSY (Left) as a surgical intervention .  The patient's history has been reviewed, patient examined, no change in status, stable for surgery.  I have reviewed the patient's chart and labs.  Questions were answered to the patient's satisfaction.     CHRISTOPHER NEWMAN

## 2016-06-30 NOTE — Discharge Instructions (Addendum)
Keep incision site dry for 24 hrs. May then take a shower and get it wet Tylenol, motrin or hydrocodone tabs 1-2 prn pain Call office for follow up appt in 6-7 days   [phone]     [place] Instructions  Activity: Get plenty of rest for the remainder of the day. A responsible adult should stay with you for 24 hours following the procedure.  For the next 24 hours, DO NOT: -Drive a car -Paediatric nurse -Drink alcoholic beverages -Take any medication unless instructed by your physician -Make any legal decisions or sign important papers.  Meals: Start with liquid foods such as gelatin or soup. Progress to regular foods as tolerated. Avoid greasy, spicy, heavy foods. If nausea and/or vomiting occur, drink only clear liquids until the nausea and/or vomiting subsides. Call your physician if vomiting continues.  Special Instructions/Symptoms: Your throat may feel dry or sore from the anesthesia or the breathing tube placed in your throat during surgery. If this causes discomfort, gargle with warm salt water. The discomfort should disappear within 24 hours.  If you had a scopolamine patch placed behind your ear for the management of post- operative nausea and/or vomiting:  1. The medication in the patch is effective for 72 hours, after which it should be removed.  Wrap patch in a tissue and discard in the trash. Wash hands thoroughly with soap and water. 2. You may remove the patch earlier than 72 hours if you experience unpleasant side effects which may include dry mouth, dizziness or visual disturbances. 3. Avoid touching the patch. Wash your hands with soap and water after contact with the patch.

## 2016-06-30 NOTE — Anesthesia Preprocedure Evaluation (Addendum)
Anesthesia Evaluation  Patient identified by MRN, date of birth, ID band Patient awake    Reviewed: Allergy & Precautions, NPO status , Patient's Chart, lab work & pertinent test results  History of Anesthesia Complications (+) PONV and history of anesthetic complications  Airway Mallampati: I  TM Distance: >3 FB Neck ROM: Full    Dental  (+) Teeth Intact, Dental Advisory Given   Pulmonary neg pulmonary ROS,    Pulmonary exam normal breath sounds clear to auscultation       Cardiovascular Exercise Tolerance: Good negative cardio ROS Normal cardiovascular exam Rhythm:Regular Rate:Normal     Neuro/Psych PSYCHIATRIC DISORDERS Anxiety Depression negative neurological ROS     GI/Hepatic negative GI ROS, Neg liver ROS,   Endo/Other  negative endocrine ROS  Renal/GU negative Renal ROS     Musculoskeletal negative musculoskeletal ROS (+)   Abdominal   Peds  Hematology negative hematology ROS (+)   Anesthesia Other Findings Day of surgery medications reviewed with the patient.  Reproductive/Obstetrics                            Anesthesia Physical Anesthesia Plan  ASA: II  Anesthesia Plan: General   Post-op Pain Management:    Induction: Intravenous  Airway Management Planned: Oral ETT and LMA  Additional Equipment:   Intra-op Plan:   Post-operative Plan: Extubation in OR  Informed Consent: I have reviewed the patients History and Physical, chart, labs and discussed the procedure including the risks, benefits and alternatives for the proposed anesthesia with the patient or authorized representative who has indicated his/her understanding and acceptance.   Dental advisory given  Plan Discussed with: CRNA  Anesthesia Plan Comments: (Risks/benefits of general anesthesia discussed with patient including risk of damage to teeth, lips, gum, and tongue, nausea/vomiting, allergic  reactions to medications, and the possibility of heart attack, stroke and death.  All patient questions answered.  Patient wishes to proceed.)       Anesthesia Quick Evaluation

## 2016-06-30 NOTE — Anesthesia Postprocedure Evaluation (Signed)
Anesthesia Post Note  Patient: KONYA SCHILD  Procedure(s) Performed: Procedure(s) (LRB): LEFT NECK LYMPH NODE BIOPSY (Left)  Patient location during evaluation: PACU Anesthesia Type: General Level of consciousness: awake and alert Pain management: pain level controlled Vital Signs Assessment: post-procedure vital signs reviewed and stable Respiratory status: spontaneous breathing, nonlabored ventilation, respiratory function stable and patient connected to nasal cannula oxygen Cardiovascular status: blood pressure returned to baseline and stable Postop Assessment: no signs of nausea or vomiting Anesthetic complications: no    Last Vitals:  Vitals:   06/30/16 1145 06/30/16 1245  BP: 114/64 130/70  Pulse: (!) 101 88  Resp: 18 20  Temp:  36.7 C    Last Pain:  Vitals:   06/30/16 1245  TempSrc:   PainSc: 0-No pain                 Catalina Gravel

## 2016-06-30 NOTE — Transfer of Care (Signed)
Immediate Anesthesia Transfer of Care Note  Patient: Autumn [name]  Procedure(s) Performed: Procedure(s) with comments: LEFT NECK LYMPH NODE BIOPSY (Left) - LEFT NECK LYMPH NODE BIOPSY  Patient Location: PACU  Anesthesia Type:General  Level of Consciousness: awake, alert  and oriented  Airway & Oxygen Therapy: Patient Spontanous Breathing and Patient connected to face mask oxygen  Post-op Assessment: Report given to RN and Post -op Vital signs reviewed and stable  Post vital signs: Reviewed and stable  Last Vitals:  Vitals:   06/30/16 0726  BP: 122/74  Pulse: (!) 117  Resp: 20  Temp: 37.4 C    Last Pain:  Vitals:   06/30/16 0726  TempSrc: Oral  PainSc: 7       Patients Stated Pain Goal: 2 ([id number])  Complications: No apparent anesthesia complications

## 2016-06-30 NOTE — Brief Op Note (Signed)
06/30/2016  10:44 AM  PATIENT:  Autumn [name]  45 y.o. female  PRE-OPERATIVE DIAGNOSIS:  ENLARGED LEFT NECK LYMPH NODE  POST-OPERATIVE DIAGNOSIS:  ENLARGED LEFT NECK LYMPH NODE  PROCEDURE:  Procedure(s) with comments: LEFT NECK LYMPH NODE BIOPSY (Left) - LEFT NECK LYMPH NODE BIOPSY  SURGEON:  Surgeon(s) and Role:    * Rozetta Nunnery, MD - Primary  PHYSICIAN ASSISTANT:   ASSISTANTS: none   ANESTHESIA:   general  EBL:  Total I/O In: 1000 [I.V.:1000] Out: -   BLOOD ADMINISTERED:none  DRAINS: none   LOCAL MEDICATIONS USED:  XYLOCAINE with EPI  SPECIMEN:  Source of Specimen:  left neck lymph node  DISPOSITION OF SPECIMEN:  PATHOLOGY  COUNTS:  YES  TOURNIQUET:  * No tourniquets in log *  DICTATION: .Other Dictation: Dictation Number [id number]  PLAN OF CARE: Discharge to home after PACU  PATIENT DISPOSITION:  PACU - hemodynamically stable.   Delay start of Pharmacological VTE agent (>24hrs) due to surgical blood loss or risk of bleeding: yes

## 2016-07-01 ENCOUNTER — Other Ambulatory Visit: Payer: Self-pay | Admitting: Hematology and Oncology

## 2016-07-01 ENCOUNTER — Encounter (HOSPITAL_BASED_OUTPATIENT_CLINIC_OR_DEPARTMENT_OTHER): Payer: Self-pay | Admitting: Otolaryngology

## 2016-07-01 NOTE — Op Note (Signed)
NAMEAERABELLA, FREELAND NO.:  [id number]  MEDICAL RECORD NO.:  [id number]  LOCATION:                                 FACILITY:  PHYSICIAN:  Hayden Lucia Gaskins, M.D.DATE OF BIRTH:  1971/04/29  DATE OF PROCEDURE:  06/30/2016 DATE OF DISCHARGE:                              OPERATIVE REPORT   PREOPERATIVE DIAGNOSIS:  Enlarged left neck nodes, questionable lymphoma.  POSTOPERATIVE DIAGNOSIS:  Enlarged left neck nodes, questionable lymphoma.  OPERATION PERFORMED:  Excisional biopsy of posterior left neck node.  SURGEON:  Leonides Sake. Lucia Gaskins, M.D.  ANESTHESIA:  General endotracheal.  COMPLICATIONS:  None.  BRIEF CLINICAL NOTE:  Autumn [name] is a 45 year old female who has had left neck lymphadenopathy for several months now.  The lymph nodes have gradually gotten little bit larger.  She has recently had a CT scan of her abdomen that was suspicious for a lymphoma.  She has had a family history with a sister with Hodgkin's lymphoma.  She was taken to the operating room at this time for excisional biopsy of left neck node to evaluate for possible lymphoma.  DESCRIPTION OF PROCEDURE:  After adequate endotracheal anesthesia, the left neck was palpated.  She has several enlarged nodes in the left neck.  It was elected to remove a posterior left neck node that measured approximately 2 cm in size in the region of the accessory nerve.  The area was injected with Xylocaine with epinephrine and prepped with Betadine solution.  Incision was made through the skin and subcutaneous tissue.  The accessory nerve was identified and basically ran directly over the lymph node.  This was dissected off and retracted laterally. There were few small veins and vessels around this area that were cauterized with bipolar cautery.  The lymph node was dissected out from underneath the sternocleidomastoid muscle.  There were several enlarged nodes in this area and the largest the  2-cm node was removed and actually fell apart little bit while removing it with breakdown of the capsule.  Specimen was sent in saline fresh for lymphoma workup to Pathology.  Hemostasis was obtained with the bipolar cautery.  The wound was irrigated with saline and closed with 3-0 chromic sutures subcutaneously and Dermabond on the skin.  Following completion of the closure with Dermabond, two small skin tags were removed and spot bandages were placed.    ______________________________ Leonides Sake. Lucia Gaskins, M.D.   ______________________________ Leonides Sake. Lucia Gaskins, M.D.    CEN/MEDQ  D:  06/30/2016  T:  06/30/2016  Job:  [id number]  cc:   Heath Lark, M.D.

## 2016-07-01 NOTE — Addendum Note (Signed)
Addendum  created 07/01/16 0857 by Marrianne Mood, CRNA   Charge Capture section accepted

## 2016-07-02 ENCOUNTER — Ambulatory Visit (HOSPITAL_BASED_OUTPATIENT_CLINIC_OR_DEPARTMENT_OTHER): Payer: Managed Care, Other (non HMO)

## 2016-07-02 ENCOUNTER — Other Ambulatory Visit: Payer: Self-pay | Admitting: Hematology and Oncology

## 2016-07-02 ENCOUNTER — Telehealth: Payer: Self-pay | Admitting: Hematology and Oncology

## 2016-07-02 ENCOUNTER — Ambulatory Visit (HOSPITAL_BASED_OUTPATIENT_CLINIC_OR_DEPARTMENT_OTHER): Payer: Managed Care, Other (non HMO) | Admitting: Hematology and Oncology

## 2016-07-02 DIAGNOSIS — R748 Abnormal levels of other serum enzymes: Secondary | ICD-10-CM | POA: Diagnosis not present

## 2016-07-02 DIAGNOSIS — C9110 Chronic lymphocytic leukemia of B-cell type not having achieved remission: Secondary | ICD-10-CM

## 2016-07-02 DIAGNOSIS — G893 Neoplasm related pain (acute) (chronic): Secondary | ICD-10-CM | POA: Diagnosis not present

## 2016-07-02 DIAGNOSIS — F411 Generalized anxiety disorder: Secondary | ICD-10-CM | POA: Diagnosis not present

## 2016-07-02 DIAGNOSIS — C8330 Diffuse large B-cell lymphoma, unspecified site: Secondary | ICD-10-CM

## 2016-07-02 DIAGNOSIS — R599 Enlarged lymph nodes, unspecified: Secondary | ICD-10-CM

## 2016-07-02 LAB — MANUAL DIFFERENTIAL
ALC: 10.1 10*3/uL — ABNORMAL HIGH (ref 0.9–3.3)
ANC (CHCC MAN DIFF): 20.1 10*3/uL — AB (ref 1.5–6.5)
BAND NEUTROPHILS: 2 % (ref 0–10)
BLASTS: 43 % — AB (ref 0–0)
Basophil: 1 % (ref 0–2)
EOS%: 2 % (ref 0–7)
LYMPH: 17 % (ref 14–49)
MONO: 2 % (ref 0–14)
MYELOCYTES: 3 % — AB (ref 0–0)
Metamyelocytes: 1 % — ABNORMAL HIGH (ref 0–0)
Other Cell: 0 % (ref 0–0)
PLT EST: DECREASED
PROMYELO: 1 % — AB (ref 0–0)
SEG: 28 % — AB (ref 38–77)
VARIANT LYMPH: 0 % (ref 0–0)
nRBC: 0 % (ref 0–0)

## 2016-07-02 LAB — COMPREHENSIVE METABOLIC PANEL
ALBUMIN: 3.4 g/dL — AB (ref 3.5–5.0)
ALK PHOS: 123 U/L (ref 40–150)
ALT: 182 U/L — AB (ref 0–55)
AST: 290 U/L — AB (ref 5–34)
Anion Gap: 10 mEq/L (ref 3–11)
BUN: 11 mg/dL (ref 7.0–26.0)
CALCIUM: 8.9 mg/dL (ref 8.4–10.4)
CHLORIDE: 108 meq/L (ref 98–109)
CO2: 23 mEq/L (ref 22–29)
CREATININE: 0.8 mg/dL (ref 0.6–1.1)
EGFR: 87 mL/min/{1.73_m2} — ABNORMAL LOW (ref >90)
Glucose: 88 mg/dl (ref 70–140)
Potassium: 3.4 mEq/L — ABNORMAL LOW (ref 3.5–5.1)
Sodium: 142 mEq/L (ref 136–145)
Total Bilirubin: 0.72 mg/dL (ref 0.20–1.20)
Total Protein: 6.2 g/dL — ABNORMAL LOW (ref 6.4–8.3)

## 2016-07-02 LAB — CBC WITH DIFFERENTIAL/PLATELET
HEMATOCRIT: 36.4 % (ref 34.8–46.6)
HEMOGLOBIN: 12.1 g/dL (ref 11.6–15.9)
MCH: 28.3 pg (ref 25.1–34.0)
MCHC: 33.3 g/dL (ref 31.5–36.0)
MCV: 85 fL (ref 79.5–101.0)
PLATELETS: 117 10*3/uL — AB (ref 145–400)
RBC: 4.28 10*6/uL (ref 3.70–5.45)
RDW: 14.2 % (ref 11.2–14.5)
WBC: 59.2 10*3/uL — AB (ref 3.9–10.3)

## 2016-07-02 LAB — LACTATE DEHYDROGENASE

## 2016-07-02 LAB — URIC ACID: Uric Acid, Serum: 5.3 mg/dl (ref 2.6–7.4)

## 2016-07-02 MED ORDER — ALLOPURINOL 300 MG PO TABS: 300.0000 mg | ORAL_TABLET | Freq: Every day | ORAL | 0 refills | Status: DC

## 2016-07-02 NOTE — Assessment & Plan Note (Addendum)
I reviewed with her the importance of staging. I reviewed with her current guidelines. 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 I suspect she may have started with low-grade lymphoma and subsequently transformed to high-grade lymphoma over the last 3 months I will get her case reviewed at the hematology tumor board next week. Initially, I plan to order a PET CT scan for staging, cardiac imaging, bone marrow biopsy and port placement. I will schedule chemotherapy education class and I will see her back next week to review all test results. She has progressive lymphadenopathy rapidly within the last 2 weeks. I have ordered c-myc mutation to rule out double hit lymphoma. The pathologist did not feel that this represented Burkitt's lymphoma I plan to start her on 1st cycle of chemotherapy tentatively on 07/08/16  However, once of blood test result is available, the patient was noted to have acute blasts seen in her peripheral blood. I directed the patient to [facility] instead for acute evaluation and admission for management of leukemia/lymphoma

## 2016-07-02 NOTE — Telephone Encounter (Signed)
09/19 appt with MD @ 1:30pm, per 07/02/16 los.  Avs report and appointment schedule given to patient, per 07/02/16 los.

## 2016-07-02 NOTE — Assessment & Plan Note (Signed)
I suspect that this is related to her underlying disease with disease progression. As above, she is directed to [facility]

## 2016-07-02 NOTE — Progress Notes (Signed)
Lymphoma and CLL - No Medical Intervention - Off Treatment.  Patient Characteristics: Double Hit Lymphoma, First Line Disease Type: Not Applicable Disease Type: Double Hit Lymphoma Line of therapy: First Line

## 2016-07-02 NOTE — Assessment & Plan Note (Signed)
She has mid to lower back pain likely related to the cancer noted on CT. We discussed treatment options. Her pain is better controlled with current prescription Dilaudid that she takes as needed

## 2016-07-02 NOTE — Progress Notes (Signed)
[place] OFFICE PROGRESS NOTE  Patient Care Team: Helane Rima, MD as PCP - General (Family Medicine) Vicie Mutters, MD as Consulting Physician (Otolaryngology)  SUMMARY OF ONCOLOGIC HISTORY:   DLBCL (diffuse large B cell lymphoma) ([place])   06/30/2016 Procedure    She had excisional lymph node biopsy in her neck      06/30/2016 Pathology Results    Accession: [id number] biopsy from neck showed DLBCB      Autumn [name] is seen here because of bilateral palpable lymphadenopathy in the neck region. This patient has strong family history of lymphoma in her sister. She was referred to see me because of self palpated enlarging lymphadenopathy in the left side of the neck.  She denies any tenderness. Denies any recent infection in the head and neck region.  She thought she might have felt a lymph node in the right groin as well. CT scan of the neck on 05/09/2015 show small reactive lymphadenopathy. Screening blood tests for autoimmune diseases are negative and she was placed on observation CT scan of the abdomen dated 06/22/2016 show lymphadenopathy, suspicious for lymphoma  INTERVAL HISTORY: Please see below for problem oriented charting. She returns for follow-up and to review test results. She is very anxious. She complained of shortness of breath. She have deep inspiration pain/discomfort since recent biopsy. She noted new lymphadenopathy in the right groin and enlargement of her abdomen. She takes her little Dilaudid for pain She complained low-grade fever at home  REVIEW OF SYSTEMS:   Eyes: Denies blurriness of vision Ears, nose, mouth, throat, and face: Denies mucositis or sore throat Respiratory: Denies cough, dyspnea or wheezes Gastrointestinal:  Denies nausea, heartburn or change in bowel habits Skin: Denies abnormal skin rashes Neurological:Denies numbness, tingling or new weaknesses Behavioral/Psych: Mood is stable, no new changes  All other  systems were reviewed with the patient and are negative.  I have reviewed the past medical history, past surgical history, social history and family history with the patient and they are unchanged from previous note.  ALLERGIES:  is allergic to codeine.  MEDICATIONS:  Current Outpatient Prescriptions  Medication Sig Dispense Refill  . acetaminophen (TYLENOL) 325 MG tablet Take 650 mg by mouth every 6 (six) hours as needed for moderate pain or headache.     Marland Kitchen HYDROmorphone (DILAUDID) 2 MG tablet Take 1 tablet (2 mg total) by mouth every 4 (four) hours as needed for severe pain. 30 tablet 0  . ibuprofen (ADVIL,MOTRIN) 800 MG tablet Take 1 tablet (800 mg total) by mouth 3 (three) times daily. 21 tablet 0  . LORazepam (ATIVAN) 0.5 MG tablet Take 1 tablet (0.5 mg total) by mouth every 8 (eight) hours. 30 tablet 0  . promethazine (PHENERGAN) 25 MG tablet Take 1 tablet (25 mg total) by mouth every 6 (six) hours as needed for nausea or vomiting. (Patient taking differently: Take 12.5 mg by mouth every 6 (six) hours as needed for nausea or vomiting. ) 60 tablet 1  . allopurinol (ZYLOPRIM) 300 MG tablet Take 1 tablet (300 mg total) by mouth daily. 30 tablet 0   No current facility-administered medications for this visit.     PHYSICAL EXAMINATION: ECOG PERFORMANCE STATUS: 1 - Symptomatic but completely ambulatory  Vitals:   07/02/16 1505  BP: (!) 161/83  Pulse: (!) 109  Temp: 100.3 F (37.9 C)   Filed Weights   07/02/16 1505  Weight: 159 lb 6.4 oz (72.3 kg)    GENERAL:alert, no distress and comfortable  SKIN: skin color, texture, turgor are normal, no rashes or significant lesions EYES: normal, Conjunctiva are pink and non-injected, sclera clear OROPHARYNX:no exudate, no erythema and lips, buccal mucosa, and tongue normal  NECK: supple, thyroid normal size, non-tender, without nodularity LYMPH: She has palpable lymphadenopathy in her neck and her groin LUNGS: clear to auscultation and  percussion with normal breathing effort HEART: regular rate & rhythm and no murmurs and no lower extremity edema ABDOMEN:abdomen soft, non-tender and normal bowel sounds. She has palpable splenomegaly Musculoskeletal:no cyanosis of digits and no clubbing  NEURO: alert & oriented x 3 with fluent speech, no focal motor/sensory deficits  LABORATORY DATA:  I have reviewed the data as listed    Component Value Date/Time   NA 142 07/02/2016 1550   K 3.4 (L) 07/02/2016 1550   CL 108 06/22/2016 0938   CO2 23 07/02/2016 1550   GLUCOSE 88 07/02/2016 1550   BUN 11.0 07/02/2016 1550   CREATININE 0.8 07/02/2016 1550   CALCIUM 8.9 07/02/2016 1550   PROT 6.2 (L) 07/02/2016 1550   ALBUMIN 3.4 (L) 07/02/2016 1550   AST 290 (HH) 07/02/2016 1550   ALT 182 (H) 07/02/2016 1550   ALKPHOS 123 07/02/2016 1550   BILITOT 0.72 07/02/2016 1550   GFRNONAA >60 06/22/2016 0938   GFRAA >60 06/22/2016 0938    No results found for: SPEP, UPEP  Lab Results  Component Value Date   WBC 59.2 (HH) 07/02/2016   NEUTROABS 3.1 06/05/2016   HGB 12.1 07/02/2016   HCT 36.4 07/02/2016   MCV 85.0 07/02/2016   PLT 117 (L) 07/02/2016      Chemistry      Component Value Date/Time   NA 142 07/02/2016 1550   K 3.4 (L) 07/02/2016 1550   CL 108 06/22/2016 0938   CO2 23 07/02/2016 1550   BUN 11.0 07/02/2016 1550   CREATININE 0.8 07/02/2016 1550      Component Value Date/Time   CALCIUM 8.9 07/02/2016 1550   ALKPHOS 123 07/02/2016 1550   AST 290 (HH) 07/02/2016 1550   ALT 182 (H) 07/02/2016 1550   BILITOT 0.72 07/02/2016 1550      ASSESSMENT & PLAN:  DLBCL (diffuse large B cell lymphoma) (HCC) I reviewed with her the importance of staging. I reviewed with her current guidelines. 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 I suspect she may have started with low-grade lymphoma and subsequently transformed to  high-grade lymphoma over the last 3 months I will get her case reviewed at the hematology tumor board next week. Initially, I plan to order a PET CT scan for staging, cardiac imaging, bone marrow biopsy and port placement. I will schedule chemotherapy education class and I will see her back next week to review all test results. She has progressive lymphadenopathy rapidly within the last 2 weeks. I have ordered c-myc mutation to rule out double hit lymphoma. The pathologist did not feel that this represented Burkitt's lymphoma I plan to start her on 1st cycle of chemotherapy tentatively on 07/08/16  However, once of blood test result is available call, the patient was noted to have acute blasts seen in her peripheral blood. I directed the patient to [facility] instead for acute evaluation and admission for management of leukemia/lymphoma  Cancer associated pain She has mid to lower back pain likely related to the cancer noted on CT. We discussed treatment  options. Her pain is better controlled with current prescription Dilaudid that she takes as needed  Generalized anxiety disorder She has generalized anxiety, understandably due to recent possible diagnosis of lymphoma. She is very tearful and nervous. I recommend a short course prescription lorazepam to take as needed. I want her risk of sedation and side effects due to potential interactions with other medications prescribed today. She is aware.  Abnormal liver enzymes I suspect that this is related to her underlying disease with disease progression. As above, she is directed to [facility]  The patient stayed to review all the test results. She is very tearful. I prayed with her and her family. I will see the patient in the future after she completed her treatment at [facility] I have spoken with Dr. Ricka Burdock at [facility] [place] specialist on-call and she is well  aware with the plan of care.  Orders Placed This Encounter  Procedures  . CBC with Differential/Platelet    Standing Status:   Future    Number of Occurrences:   1    Standing Expiration Date:   08/06/2017  . Comprehensive metabolic panel    Standing Status:   Future    Number of Occurrences:   1    Standing Expiration Date:   08/06/2017  . Lactate dehydrogenase    Standing Status:   Future    Number of Occurrences:   1    Standing Expiration Date:   08/06/2017  . Hepatitis B core antibody, IgM    Standing Status:   Future    Number of Occurrences:   1    Standing Expiration Date:   08/06/2017  . Hepatitis B surface antibody    Standing Status:   Future    Number of Occurrences:   1    Standing Expiration Date:   08/06/2017  . Hepatitis B surface antigen    Standing Status:   Future    Number of Occurrences:   1    Standing Expiration Date:   08/06/2017  . Uric acid    Standing Status:   Future    Number of Occurrences:   1    Standing Expiration Date:   08/06/2017  . HIV antibody (with reflex)    Standing Status:   Future    Number of Occurrences:   1    Standing Expiration Date:   07/02/2017   All questions were answered. The patient knows to call the clinic with any problems, questions or concerns. No barriers to learning was detected. I spent 60 minutes counseling the patient face to face. The total time spent in the appointment was 80 minutes and more than 50% was on counseling and review of test results     [facility], [place], MD 07/02/2016 5:35 PM

## 2016-07-02 NOTE — Assessment & Plan Note (Signed)
She has generalized anxiety, understandably due to recent possible diagnosis of lymphoma. She is very tearful and nervous. I recommend a short course prescription lorazepam to take as needed. I want her risk of sedation and side effects due to potential interactions with other medications prescribed today. She is aware.

## 2016-07-03 ENCOUNTER — Ambulatory Visit: Payer: Managed Care, Other (non HMO) | Admitting: Hematology and Oncology

## 2016-07-03 ENCOUNTER — Other Ambulatory Visit: Payer: Managed Care, Other (non HMO)

## 2016-07-03 LAB — HEPATITIS B SURFACE ANTIBODY,QUALITATIVE: Hep B Surface Ab, Qual: REACTIVE

## 2016-07-03 LAB — HEPATITIS B SURFACE ANTIGEN: HBsAg Screen: NEGATIVE

## 2016-07-03 LAB — HIV ANTIBODY (ROUTINE TESTING W REFLEX): HIV Screen 4th Generation wRfx: NONREACTIVE

## 2016-07-03 LAB — HEPATITIS B CORE ANTIBODY, IGM: HEP B C IGM: NEGATIVE

## 2016-07-07 ENCOUNTER — Ambulatory Visit: Payer: Managed Care, Other (non HMO) | Admitting: Hematology and Oncology

## 2016-07-14 ENCOUNTER — Telehealth: Payer: Self-pay | Admitting: *Deleted

## 2016-07-14 LAB — TISSUE HYBRIDIZATION TO NCBH

## 2016-07-14 NOTE — Telephone Encounter (Signed)
We need 5 days prior auth for neupogen and infusion room on Friday is overbooked Please let them know we are willing to take care of the patient in the future if they give [place] 1 week advanced notice

## 2016-07-14 NOTE — Telephone Encounter (Signed)
Called Angie back and left her Vm informing her of Dr. Calton Dach reply.  Dr. Alvy Bimler did add she could see pt w/ lab work on Friday if needed,  But we are unable to give Neupogen without 5 days notice for prior auth.   Maybe they can arrange for pt to give herself Neupogen at home?

## 2016-07-14 NOTE — Telephone Encounter (Signed)
RN, Baron Sane, From [facility] reports pt being d/c'd home today.  Pt needs Neupogen Wed, Thurs, Friday this week with Labs on Friday.  She asks if it can be done at our [place]?  Pt returns to see Dr. Cassell Clement next week Wed 10/4.  Angie's call back [phone]

## 2016-07-17 ENCOUNTER — Other Ambulatory Visit: Payer: Self-pay | Admitting: Family Medicine

## 2016-07-17 DIAGNOSIS — Z1231 Encounter for screening mammogram for malignant neoplasm of breast: Secondary | ICD-10-CM

## 2016-07-22 ENCOUNTER — Other Ambulatory Visit: Payer: Self-pay | Admitting: Hematology and Oncology

## 2016-07-23 ENCOUNTER — Ambulatory Visit
Admission: RE | Admit: 2016-07-23 | Discharge: 2016-07-23 | Disposition: A | Discharge status: Home / Self Care | Payer: Managed Care, Other (non HMO) | Source: Ambulatory Visit | Attending: Family Medicine | Admitting: Family Medicine

## 2016-07-23 DIAGNOSIS — Z1231 Encounter for screening mammogram for malignant neoplasm of breast: Secondary | ICD-10-CM

## 2016-07-27 ENCOUNTER — Telehealth: Payer: Self-pay | Admitting: *Deleted

## 2016-07-27 NOTE — Telephone Encounter (Signed)
VM from Baron Sane, RN at [facility].  Pt currently inpatient at [facility].  She asks if we can schedule pt for Labs and Neulasta on Monday 10/16 and Labs Monday and Thursdays?  She will fax orders if we can do this.  Her call back number [phone].

## 2016-07-28 ENCOUNTER — Other Ambulatory Visit: Payer: Self-pay | Admitting: Hematology and Oncology

## 2016-07-28 DIAGNOSIS — C8338 Diffuse large B-cell lymphoma, lymph nodes of multiple sites: Secondary | ICD-10-CM

## 2016-07-28 NOTE — Telephone Encounter (Signed)
I placed scheduling messages and email to get prior auth for Neulasta and labs/transfusions scheduling messages and rtn appt

## 2016-07-28 NOTE — Telephone Encounter (Signed)
LVM for Baron Sane informing her of requests sent to Scheduling for appts to be made as requested.  Please fax [place] orders.  Call back by end of this week to check schedule.

## 2016-07-30 ENCOUNTER — Encounter (HOSPITAL_COMMUNITY): Payer: Self-pay

## 2016-07-30 ENCOUNTER — Telehealth: Payer: Self-pay | Admitting: Hematology and Oncology

## 2016-07-30 ENCOUNTER — Telehealth: Payer: Self-pay | Admitting: *Deleted

## 2016-07-30 NOTE — Telephone Encounter (Signed)
Baron Sane, RN from [facility] called to confirm pt's schedule for Monday 10/16.  Informed her pt is scheduled for lab/MD and injection. She will inform pt of schedule.  Pt being d/c'd from [facility].

## 2016-07-30 NOTE — Telephone Encounter (Signed)
lvm to inform pt of 10/16 appt times per LOS. Staff message to schedule chemo dates

## 2016-08-03 ENCOUNTER — Other Ambulatory Visit (HOSPITAL_BASED_OUTPATIENT_CLINIC_OR_DEPARTMENT_OTHER): Payer: Managed Care, Other (non HMO)

## 2016-08-03 ENCOUNTER — Other Ambulatory Visit: Payer: Self-pay | Admitting: Hematology and Oncology

## 2016-08-03 ENCOUNTER — Other Ambulatory Visit: Payer: Managed Care, Other (non HMO)

## 2016-08-03 ENCOUNTER — Ambulatory Visit: Payer: Managed Care, Other (non HMO) | Admitting: Hematology and Oncology

## 2016-08-03 ENCOUNTER — Ambulatory Visit (HOSPITAL_COMMUNITY)
Admission: RE | Admit: 2016-08-03 | Discharge: 2016-08-03 | Disposition: A | Discharge status: Home / Self Care | Payer: Managed Care, Other (non HMO) | Source: Ambulatory Visit | Attending: Hematology and Oncology | Admitting: Hematology and Oncology

## 2016-08-03 ENCOUNTER — Ambulatory Visit (HOSPITAL_BASED_OUTPATIENT_CLINIC_OR_DEPARTMENT_OTHER): Payer: Managed Care, Other (non HMO) | Admitting: Hematology and Oncology

## 2016-08-03 ENCOUNTER — Ambulatory Visit: Payer: Managed Care, Other (non HMO)

## 2016-08-03 ENCOUNTER — Telehealth: Payer: Self-pay | Admitting: Hematology and Oncology

## 2016-08-03 ENCOUNTER — Encounter: Payer: Self-pay | Admitting: Hematology and Oncology

## 2016-08-03 VITALS — BP 124/68 | HR 116 | Temp 98.0°F | Resp 18 | Ht 66.0 in | Wt 141.8 lb

## 2016-08-03 DIAGNOSIS — Z5189 Encounter for other specified aftercare: Secondary | ICD-10-CM | POA: Diagnosis not present

## 2016-08-03 DIAGNOSIS — M79604 Pain in right leg: Secondary | ICD-10-CM | POA: Diagnosis not present

## 2016-08-03 DIAGNOSIS — D61818 Other pancytopenia: Secondary | ICD-10-CM | POA: Diagnosis not present

## 2016-08-03 DIAGNOSIS — C8338 Diffuse large B-cell lymphoma, lymph nodes of multiple sites: Secondary | ICD-10-CM

## 2016-08-03 DIAGNOSIS — R599 Enlarged lymph nodes, unspecified: Secondary | ICD-10-CM | POA: Diagnosis not present

## 2016-08-03 DIAGNOSIS — C8590 Non-Hodgkin lymphoma, unspecified, unspecified site: Secondary | ICD-10-CM | POA: Diagnosis not present

## 2016-08-03 LAB — COMPREHENSIVE METABOLIC PANEL
ALBUMIN: 4 g/dL (ref 3.5–5.0)
ALK PHOS: 66 U/L (ref 40–150)
ALT: 20 U/L (ref 0–55)
AST: 15 U/L (ref 5–34)
Anion Gap: 10 mEq/L (ref 3–11)
BILIRUBIN TOTAL: 1.11 mg/dL (ref 0.20–1.20)
BUN: 15 mg/dL (ref 7.0–26.0)
CO2: 27 meq/L (ref 22–29)
Calcium: 9.7 mg/dL (ref 8.4–10.4)
Chloride: 106 mEq/L (ref 98–109)
Creatinine: 0.7 mg/dL (ref 0.6–1.1)
EGFR: >90 mL/min/{1.73_m2} (ref >90)
GLUCOSE: 117 mg/dL (ref 70–140)
Potassium: 4.1 mEq/L (ref 3.5–5.1)
SODIUM: 143 meq/L (ref 136–145)
TOTAL PROTEIN: 6.6 g/dL (ref 6.4–8.3)

## 2016-08-03 LAB — CBC WITH DIFFERENTIAL/PLATELET
BASO%: 0.7 % (ref 0.0–2.0)
BASOS ABS: 0 10*3/uL (ref 0.0–0.1)
EOS%: 0.5 % (ref 0.0–7.0)
Eosinophils Absolute: 0 10*3/uL (ref 0.0–0.5)
HEMATOCRIT: 37 % (ref 34.8–46.6)
HGB: 12.2 g/dL (ref 11.6–15.9)
LYMPH#: 0.4 10*3/uL — AB (ref 0.9–3.3)
LYMPH%: 11.9 % — AB (ref 14.0–49.7)
MCH: 27.6 pg (ref 25.1–34.0)
MCHC: 33 g/dL (ref 31.5–36.0)
MCV: 83.6 fL (ref 79.5–101.0)
MONO#: 0.2 10*3/uL (ref 0.1–0.9)
MONO%: 4.8 % (ref 0.0–14.0)
NEUT#: 3 10*3/uL (ref 1.5–6.5)
NEUT%: 82.1 % — AB (ref 38.4–76.8)
PLATELETS: 115 10*3/uL — AB (ref 145–400)
RBC: 4.42 10*6/uL (ref 3.70–5.45)
RDW: 16.3 % — ABNORMAL HIGH (ref 11.2–14.5)
WBC: 3.6 10*3/uL — ABNORMAL LOW (ref 3.9–10.3)

## 2016-08-03 LAB — TECHNOLOGIST REVIEW

## 2016-08-03 MED ORDER — PEGFILGRASTIM INJECTION 6 MG/0.6ML
6.0000 mg | Freq: Once | SUBCUTANEOUS | Status: AC
  Administered 2016-08-03: 6 mg via SUBCUTANEOUS
  Filled 2016-08-03: qty 0.6

## 2016-08-03 NOTE — Progress Notes (Signed)
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      07/06/2016 -  Chemotherapy    The patient had DOXOrubicin (ADRIAMYCIN) 18 mg, etoposide (VEPESID) 92 mg, vinCRIStine (ONCOVIN) 0.7 mg in sodium chloride 0.9 % 500 mL chemo infusion, , Intravenous, Once, 0 of 4 cycles  ondansetron (ZOFRAN) 8 mg, dexamethasone (DECADRON) 10 mg in sodium chloride 0.9 % 50 mL IVPB, , Intravenous,  Once, 0 of 4 cycles  riTUXimab (RITUXAN) 700 mg in sodium chloride 0.9 % 250 mL (2.1875 mg/mL) chemo infusion, 375 mg/m2 = 700 mg, Intravenous,  Once, 0 of 4 cycles  cyclophosphamide (CYTOXAN) 1,380 mg in sodium chloride 0.9 % 250 mL chemo infusion, 750 mg/m2, Intravenous,  Once, 0 of 4 cycles  for chemotherapy treatment at [facility]       07/27/2016 Procedure    She had successful placement of a port-catheter with tip terminating in the right atrium      08/03/2016 Imaging    Venous Doppler ultrasound of the right lower extremity excluded DVT       INTERVAL HISTORY: Please see below for problem oriented charting. She is seen today for supportive care. She tolerated treatment well. Last night, she have acute right calf pain, rated her pain at 2 out of 10. There is no associated swelling. The pain improves with Tylenol  as needed. She denies fever or chills. No nausea or vomiting. She has very mild peripheral neuropathy but not severe.  REVIEW OF SYSTEMS:   Constitutional: Denies fevers, chills or abnormal weight loss Eyes: Denies blurriness of vision Ears, nose, mouth, throat, and face: Denies mucositis or sore throat Respiratory: Denies cough, dyspnea or wheezes Cardiovascular: Denies palpitation, chest discomfort or lower extremity swelling Gastrointestinal:  Denies nausea, heartburn or change in bowel habits Skin: Denies abnormal skin rashes Lymphatics: Denies new lymphadenopathy or easy bruising Neurological:Denies numbness, tingling or new weaknesses Behavioral/Psych: Mood is stable, no new changes  All other systems were reviewed with the patient and are negative.  I have reviewed the past medical history, past surgical history, social history and family history with the patient and they are unchanged from previous note.  ALLERGIES:  is allergic to codeine.  MEDICATIONS:  Current Outpatient Prescriptions  Medication Sig Dispense Refill  . acetaminophen (TYLENOL) 325 MG tablet Take 650 mg by mouth every 6 (six) hours as needed for moderate pain or headache.     . docusate sodium (COLACE) 100 MG capsule Take 100 mg by mouth daily as needed for mild constipation.    Marland Kitchen loratadine (CLARITIN) 10 MG tablet Take 10 mg by mouth daily as needed for allergies.    Marland Kitchen LORazepam (ATIVAN) 0.5 MG tablet Take 1 tablet (0.5 mg total) by mouth every 8 (eight) hours. (Patient taking differently: Take 0.5 mg by mouth every 8 (  eight) hours as needed. ) 30 tablet 0  . ondansetron (ZOFRAN) 8 MG tablet Take by mouth every 8 (eight) hours as needed for nausea or vomiting.    . promethazine (PHENERGAN) 25 MG tablet Take 1 tablet (25 mg total) by mouth every 6 (six) hours as needed for nausea or vomiting. (Patient taking differently: Take 12.5 mg by mouth every 6 (six) hours as needed for nausea or vomiting. ) 60 tablet 1   . sulfamethoxazole-trimethoprim (BACTRIM DS,SEPTRA DS) 800-160 MG tablet Take 1 tablet by mouth 3 (three) times a week.     No current facility-administered medications for this visit.     PHYSICAL EXAMINATION: ECOG PERFORMANCE STATUS: 1 - Symptomatic but completely ambulatory  Vitals:   08/03/16 1002  BP: 124/68  Pulse: (!) 116  Resp: 18  Temp: 98 F (36.7 C)   Filed Weights   08/03/16 1002  Weight: 141 lb 12.8 oz (64.3 kg)    GENERAL:alert, no distress and comfortable SKIN: skin color, texture, turgor are normal, no rashes or significant lesions EYES: normal, Conjunctiva are pink and non-injected, sclera clear OROPHARYNX:no exudate, no erythema and lips, buccal mucosa, and tongue normal  NECK: supple, thyroid normal size, non-tender, without nodularity LYMPH:  no palpable lymphadenopathy in the cervical, axillary or inguinal LUNGS: clear to auscultation and percussion with normal breathing effort HEART: regular rate & rhythm and no murmurs and no lower extremity edema ABDOMEN:abdomen soft, non-tender and normal bowel sounds Musculoskeletal:no cyanosis of digits and no clubbing  NEURO: alert & oriented x 3 with fluent speech, no focal motor/sensory deficits  LABORATORY DATA:  I have reviewed the data as listed    Component Value Date/Time   NA 143 08/03/2016 0940   K 4.1 08/03/2016 0940   CL 108 06/22/2016 0938   CO2 27 08/03/2016 0940   GLUCOSE 117 08/03/2016 0940   BUN 15.0 08/03/2016 0940   CREATININE 0.7 08/03/2016 0940   CALCIUM 9.7 08/03/2016 0940   PROT 6.6 08/03/2016 0940   ALBUMIN 4.0 08/03/2016 0940   AST 15 08/03/2016 0940   ALT 20 08/03/2016 0940   ALKPHOS 66 08/03/2016 0940   BILITOT 1.11 08/03/2016 0940   GFRNONAA >60 06/22/2016 0938   GFRAA >60 06/22/2016 0938    No results found for: SPEP, UPEP  Lab Results  Component Value Date   WBC 3.6 (L) 08/03/2016   NEUTROABS 3.0 08/03/2016   HGB 12.2 08/03/2016   HCT 37.0 08/03/2016   MCV 83.6  08/03/2016   PLT 115 (L) 08/03/2016      Chemistry      Component Value Date/Time   NA 143 08/03/2016 0940   K 4.1 08/03/2016 0940   CL 108 06/22/2016 0938   CO2 27 08/03/2016 0940   BUN 15.0 08/03/2016 0940   CREATININE 0.7 08/03/2016 0940      Component Value Date/Time   CALCIUM 9.7 08/03/2016 0940   ALKPHOS 66 08/03/2016 0940   AST 15 08/03/2016 0940   ALT 20 08/03/2016 0940   BILITOT 1.11 08/03/2016 0940      I review her peripheral blood which confirmed circulating blast estimated to be less than 5% ASSESSMENT & PLAN:  DLBCL (diffuse large B cell lymphoma) (HCC) Clinically, she is responding to treatment. Her calculated peripheral blast count is down to 4% today. She tolerated treatment without major side effects. She will return next week for repeat imaging study and further future treatment as directed by her lymphoma specialist at [facility]. I will continue to provide supportive care as needed She will receive G-CSF injection today  Pancytopenia, acquired [facility]) This is likely due to recent treatment. The patient denies recent history of bleeding such as epistaxis, hematuria or hematochezia. She is asymptomatic from the low platelet count and neutropenia. I will observe for now.  she does not require transfusion now. She will receive G-CSF today as directed  Right leg pain She has acute right leg pain. Patient is reassured that the ultrasound venous Doppler was negative for DVT. I recommend conservative management with Tylenol as needed   Orders Placed This Encounter  Procedures  . SCHEDULING COMMUNICATION INJECTION    Schedule 15 minute injection appointment   All questions were answered. The patient knows to call the clinic with any problems, questions or concerns. No barriers to learning was detected. I spent 25 minutes counseling the patient face to face. The total time spent in the appointment was 30 minutes and more than 50% was on  counseling and review of test results     Heath Lark, MD 08/03/2016 12:57 PM

## 2016-08-03 NOTE — Telephone Encounter (Signed)
Avs report and appointment schedule given to patient, per 08/03/16 los. °

## 2016-08-03 NOTE — Assessment & Plan Note (Signed)
Clinically, she is responding to treatment. Her calculated peripheral blast count is down to 4% today. She tolerated treatment without major side effects. She will return next week for repeat imaging study and further future treatment as directed by her lymphoma specialist at [facility]. I will continue to provide supportive care as needed She will receive G-CSF injection today

## 2016-08-03 NOTE — Assessment & Plan Note (Signed)
This is likely due to recent treatment. The patient denies recent history of bleeding such as epistaxis, hematuria or hematochezia. She is asymptomatic from the low platelet count and neutropenia. I will observe for now.  she does not require transfusion now. She will receive G-CSF today as directed

## 2016-08-03 NOTE — Assessment & Plan Note (Signed)
She has acute right leg pain. Patient is reassured that the ultrasound venous Doppler was negative for DVT. I recommend conservative management with Tylenol as needed

## 2016-08-03 NOTE — Progress Notes (Signed)
VASCULAR LAB PRELIMINARY  PRELIMINARY  PRELIMINARY  PRELIMINARY Right lower extremity venous duplex completed.    Preliminary report:  Right:  No evidence of DVT, superficial thrombosis, or Baker's cyst.  Johnie Makki, RVS 08/03/2016, 11:27 AM

## 2016-08-06 ENCOUNTER — Other Ambulatory Visit (HOSPITAL_BASED_OUTPATIENT_CLINIC_OR_DEPARTMENT_OTHER): Payer: Managed Care, Other (non HMO)

## 2016-08-06 DIAGNOSIS — C8338 Diffuse large B-cell lymphoma, lymph nodes of multiple sites: Secondary | ICD-10-CM

## 2016-08-06 LAB — CBC WITH DIFFERENTIAL/PLATELET
BASO%: 1.6 % (ref 0.0–2.0)
BASOS ABS: 0 10*3/uL (ref 0.0–0.1)
EOS ABS: 0 10*3/uL (ref 0.0–0.5)
EOS%: 0.8 % (ref 0.0–7.0)
HEMATOCRIT: 30.2 % — AB (ref 34.8–46.6)
HEMOGLOBIN: 9.9 g/dL — AB (ref 11.6–15.9)
LYMPH#: 0.5 10*3/uL — AB (ref 0.9–3.3)
LYMPH%: 20.8 % (ref 14.0–49.7)
MCH: 27.6 pg (ref 25.1–34.0)
MCHC: 32.8 g/dL (ref 31.5–36.0)
MCV: 84.1 fL (ref 79.5–101.0)
MONO#: 0.1 10*3/uL (ref 0.1–0.9)
MONO%: 4.4 % (ref 0.0–14.0)
NEUT#: 1.8 10*3/uL (ref 1.5–6.5)
NEUT%: 72.4 % (ref 38.4–76.8)
Platelets: 51 10*3/uL — ABNORMAL LOW (ref 145–400)
RBC: 3.59 10*6/uL — ABNORMAL LOW (ref 3.70–5.45)
RDW: 16 % — AB (ref 11.2–14.5)
WBC: 2.5 10*3/uL — ABNORMAL LOW (ref 3.9–10.3)

## 2016-08-06 LAB — COMPREHENSIVE METABOLIC PANEL
ALT: 13 U/L (ref 0–55)
ANION GAP: 9 meq/L (ref 3–11)
AST: 11 U/L (ref 5–34)
Albumin: 3.7 g/dL (ref 3.5–5.0)
Alkaline Phosphatase: 66 U/L (ref 40–150)
BUN: 14.9 mg/dL (ref 7.0–26.0)
CHLORIDE: 106 meq/L (ref 98–109)
CO2: 26 meq/L (ref 22–29)
CREATININE: 0.7 mg/dL (ref 0.6–1.1)
Calcium: 9.2 mg/dL (ref 8.4–10.4)
EGFR: >90 mL/min/{1.73_m2} (ref >90)
Glucose: 96 mg/dl (ref 70–140)
Potassium: 4.1 mEq/L (ref 3.5–5.1)
SODIUM: 141 meq/L (ref 136–145)
Total Bilirubin: 0.78 mg/dL (ref 0.20–1.20)
Total Protein: 6 g/dL — ABNORMAL LOW (ref 6.4–8.3)

## 2016-08-06 LAB — TECHNOLOGIST REVIEW: Technologist Review: 5

## 2016-08-10 ENCOUNTER — Telehealth: Payer: Self-pay | Admitting: *Deleted

## 2016-08-10 NOTE — Telephone Encounter (Signed)
Pt left VM earlier today reporting ED visit to [facility] over the Weekend.  She called [facility] today and s/w dr. Cassell Clement.  See "copy and pasted" note below taken from [place].  Pt has appt here for lab in the morning at 11:15 am.   Called pt back and left her VM to notify Nurse when she is here for lab so we can get update on her symptoms/condition.    Note from [facility]:  "Called patient. Her fever started yesterday. Tmax was 101.4 today. She also has a cough. Her 45-year old son has similar symptoms. Her RVP in the ED yesterday came back showing parainfluenza which is the likely cause of fevers. I asked her to take Tylenol 500 mg as needed tonight and drink plenty of fluids. If her cough or shortness of breath worsens, she may need hospital admission. She will have her labs checked tomorrow with Dr. Alvy Bimler.  Also, I informed her about the revised pathology report from her peripheral blood flow cytometry which does show that the "blasts" are circulating lymphoma cells. She was understandably tearful to hear the results. She has a PET CT scheduled later this week. We will plan on switching her chemotherapy to a more aggressive regimen (R-CODOX-M/IVAC) with the next cycle.  Electronically signed by: Hessie Dibble, MD 08/10/16 1601   Back to top of Miscellaneous Notes Telephone Encounter - Argentina Donovan, RN - 08/10/2016 2:28 PM EDT Spoke to Mrs. Andres Labrum. She states her temperature has been 99.9 to 101.4 at present. She has taken no Tylenol today. Was seen in ED yesterday and work up was done. She was given 1 gram of Tylenol yesterday. Has had cough since Thursday. Patient's son has been sick. She is to have labs drawn tomorrow at clinic in [place].  Route to Dr. Cassell Clement non BMT pool and text paged Velora Heckler, PA-C. Electronically signed by: Argentina Donovan, RN 08/10/2016 2:38 PM   Electronically signed by: Argentina Donovan, RN 08/10/16 1439   Back to top of  Miscellaneous Notes Telephone Encounter - Marylee Floras D - 08/10/2016 2:24 PM EDT Patient calling in regards to message left earlier this morning. Patient says she has a cough, and her fever is now running 101.4. Patient did address that her child was sick last week. Patient is asking if she should be taking Tylenol.   Patient denied vomiting, or diarrhea.   Call transferred to [facility], Nurse Triage.   Back to top of Miscellaneous Notes Telephone Encounter - Venita Sheffield Avonti - 08/10/2016 1:06 PM EDT Thank you.  Back to top of Miscellaneous Notes Telephone Encounter - Kandee Keen, [place] - 08/10/2016 9:37 AM EDT Will forward to Dr. Cassell Clement, non-BMT pool.   Electronically signed by: Kandee Keen, [place] 08/10/16 [phone]   Back to top of Miscellaneous Notes Telephone Encounter - Weldon Picking - 08/10/2016 9:32 AM EDT Patient states that she was in the emergency department on 08/09/2016. Patient's symptoms were a low grade fever, and cough. Patient's temperature this morning was 100.9, and has not dropped to 99. Patient states that a chest x-ray and urinalysis was completed in the ER and the results came back normal. Patient states that she finished chemotherapy last week, and her son (29 years old) also had a cough last week. Patient states that the emergency physicians advised her to make Dr. Cassell Clement aware of her ER visit. Please call patient at [phone]. " Back to top of Miscellaneous Notes

## 2016-08-11 ENCOUNTER — Other Ambulatory Visit (HOSPITAL_BASED_OUTPATIENT_CLINIC_OR_DEPARTMENT_OTHER): Payer: Managed Care, Other (non HMO)

## 2016-08-11 ENCOUNTER — Other Ambulatory Visit: Payer: Self-pay | Admitting: Hematology and Oncology

## 2016-08-11 DIAGNOSIS — C8338 Diffuse large B-cell lymphoma, lymph nodes of multiple sites: Secondary | ICD-10-CM

## 2016-08-11 LAB — COMPREHENSIVE METABOLIC PANEL
ALK PHOS: 81 U/L (ref 40–150)
ALT: 29 U/L (ref 0–55)
ANION GAP: 9 meq/L (ref 3–11)
AST: 35 U/L — ABNORMAL HIGH (ref 5–34)
Albumin: 4.2 g/dL (ref 3.5–5.0)
BILIRUBIN TOTAL: 0.36 mg/dL (ref 0.20–1.20)
BUN: 9.8 mg/dL (ref 7.0–26.0)
CO2: 26 meq/L (ref 22–29)
Calcium: 9.4 mg/dL (ref 8.4–10.4)
Chloride: 110 mEq/L — ABNORMAL HIGH (ref 98–109)
Creatinine: 0.8 mg/dL (ref 0.6–1.1)
EGFR: 89 mL/min/{1.73_m2} — AB (ref >90)
GLUCOSE: 117 mg/dL (ref 70–140)
POTASSIUM: 4 meq/L (ref 3.5–5.1)
SODIUM: 144 meq/L (ref 136–145)
Total Protein: 6.8 g/dL (ref 6.4–8.3)

## 2016-08-11 LAB — CBC WITH DIFFERENTIAL/PLATELET
BASO%: 0.3 % (ref 0.0–2.0)
BASOS ABS: 0 10*3/uL (ref 0.0–0.1)
EOS%: 0.1 % (ref 0.0–7.0)
Eosinophils Absolute: 0 10*3/uL (ref 0.0–0.5)
HEMATOCRIT: 34 % — AB (ref 34.8–46.6)
HEMOGLOBIN: 11.1 g/dL — AB (ref 11.6–15.9)
LYMPH#: 0.7 10*3/uL — AB (ref 0.9–3.3)
LYMPH%: 5.7 % — ABNORMAL LOW (ref 14.0–49.7)
MCH: 27.7 pg (ref 25.1–34.0)
MCHC: 32.7 g/dL (ref 31.5–36.0)
MCV: 84.6 fL (ref 79.5–101.0)
MONO#: 0.7 10*3/uL (ref 0.1–0.9)
MONO%: 5.8 % (ref 0.0–14.0)
NEUT#: 10.3 10*3/uL — ABNORMAL HIGH (ref 1.5–6.5)
NEUT%: 88.1 % — AB (ref 38.4–76.8)
PLATELETS: 91 10*3/uL — AB (ref 145–400)
RBC: 4.02 10*6/uL (ref 3.70–5.45)
RDW: 16.9 % — AB (ref 11.2–14.5)
WBC: 11.7 10*3/uL — ABNORMAL HIGH (ref 3.9–10.3)

## 2016-08-24 ENCOUNTER — Telehealth: Payer: Self-pay | Admitting: *Deleted

## 2016-08-24 ENCOUNTER — Other Ambulatory Visit: Payer: Self-pay | Admitting: Hematology and Oncology

## 2016-08-24 DIAGNOSIS — C8338 Diffuse large B-cell lymphoma, lymph nodes of multiple sites: Secondary | ICD-10-CM

## 2016-08-24 NOTE — Telephone Encounter (Signed)
Starling Manns, RN from [facility] called to arrange lab and neulasta injection on Tuesday, 09/01/16. Pt is on new chemo CODOX-M/IVAC.

## 2016-08-24 NOTE — Telephone Encounter (Signed)
I placed scheduling msg 

## 2016-08-26 ENCOUNTER — Telehealth: Payer: Self-pay | Admitting: Hematology and Oncology

## 2016-08-26 NOTE — Telephone Encounter (Signed)
lvm to inform pt of 11/14 appt date/times er [facility] [employer]

## 2016-08-27 ENCOUNTER — Ambulatory Visit (HOSPITAL_COMMUNITY)
Admission: RE | Admit: 2016-08-27 | Discharge: 2016-08-27 | Disposition: A | Discharge status: Home / Self Care | Payer: Managed Care, Other (non HMO) | Source: Ambulatory Visit | Attending: Hematology and Oncology | Admitting: Hematology and Oncology

## 2016-08-27 DIAGNOSIS — C8338 Diffuse large B-cell lymphoma, lymph nodes of multiple sites: Secondary | ICD-10-CM

## 2016-08-27 DIAGNOSIS — D61818 Other pancytopenia: Secondary | ICD-10-CM

## 2016-08-31 ENCOUNTER — Telehealth: Payer: Self-pay | Admitting: *Deleted

## 2016-08-31 NOTE — Telephone Encounter (Signed)
VM from [place], RN at [facility] states she is faxing over new orders for pt's appts tomorrow.  Dr. Cassell Clement would like pt to have Platelet Transfusion tomorrow if her Platelets are less than 50,000 due to I.T. chemo the next day on 11/15.   Vivian's call back [phone].

## 2016-08-31 NOTE — Telephone Encounter (Signed)
OK 

## 2016-09-01 ENCOUNTER — Telehealth: Payer: Self-pay | Admitting: Hematology and Oncology

## 2016-09-01 ENCOUNTER — Ambulatory Visit (HOSPITAL_BASED_OUTPATIENT_CLINIC_OR_DEPARTMENT_OTHER): Payer: Managed Care, Other (non HMO)

## 2016-09-01 ENCOUNTER — Telehealth: Payer: Self-pay | Admitting: *Deleted

## 2016-09-01 ENCOUNTER — Ambulatory Visit: Payer: Managed Care, Other (non HMO)

## 2016-09-01 ENCOUNTER — Other Ambulatory Visit (HOSPITAL_BASED_OUTPATIENT_CLINIC_OR_DEPARTMENT_OTHER): Payer: Managed Care, Other (non HMO)

## 2016-09-01 ENCOUNTER — Ambulatory Visit (HOSPITAL_BASED_OUTPATIENT_CLINIC_OR_DEPARTMENT_OTHER): Payer: Managed Care, Other (non HMO) | Admitting: Hematology and Oncology

## 2016-09-01 VITALS — BP 112/85 | HR 79 | Temp 98.7°F | Resp 18

## 2016-09-01 VITALS — BP 134/80 | HR 89 | Temp 98.1°F | Resp 18 | Wt 143.1 lb

## 2016-09-01 DIAGNOSIS — C8338 Diffuse large B-cell lymphoma, lymph nodes of multiple sites: Secondary | ICD-10-CM

## 2016-09-01 DIAGNOSIS — Z5189 Encounter for other specified aftercare: Secondary | ICD-10-CM

## 2016-09-01 DIAGNOSIS — D61818 Other pancytopenia: Secondary | ICD-10-CM | POA: Diagnosis present

## 2016-09-01 DIAGNOSIS — K649 Unspecified hemorrhoids: Secondary | ICD-10-CM

## 2016-09-01 LAB — TECHNOLOGIST REVIEW

## 2016-09-01 LAB — COMPREHENSIVE METABOLIC PANEL
ALBUMIN: 3.6 g/dL (ref 3.5–5.0)
ALT: 16 U/L (ref 0–55)
AST: 13 U/L (ref 5–34)
Alkaline Phosphatase: 51 U/L (ref 40–150)
Anion Gap: 10 mEq/L (ref 3–11)
BILIRUBIN TOTAL: 1.63 mg/dL — AB (ref 0.20–1.20)
BUN: 14.1 mg/dL (ref 7.0–26.0)
CO2: 24 meq/L (ref 22–29)
Calcium: 9 mg/dL (ref 8.4–10.4)
Chloride: 106 mEq/L (ref 98–109)
Creatinine: 0.7 mg/dL (ref 0.6–1.1)
GLUCOSE: 94 mg/dL (ref 70–140)
Potassium: 3.4 mEq/L — ABNORMAL LOW (ref 3.5–5.1)
SODIUM: 140 meq/L (ref 136–145)
TOTAL PROTEIN: 5.6 g/dL — AB (ref 6.4–8.3)

## 2016-09-01 LAB — CBC WITH DIFFERENTIAL/PLATELET
BASO%: 4.3 % — ABNORMAL HIGH (ref 0.0–2.0)
BASOS ABS: 0 10*3/uL (ref 0.0–0.1)
EOS%: 4.3 % (ref 0.0–7.0)
Eosinophils Absolute: 0 10*3/uL (ref 0.0–0.5)
HCT: 24.1 % — ABNORMAL LOW (ref 34.8–46.6)
HGB: 8.5 g/dL — ABNORMAL LOW (ref 11.6–15.9)
LYMPH#: 0.2 10*3/uL — AB (ref 0.9–3.3)
LYMPH%: 87 % — AB (ref 14.0–49.7)
MCH: 28.1 pg (ref 25.1–34.0)
MCHC: 35.3 g/dL (ref 31.5–36.0)
MCV: 79.5 fL (ref 79.5–101.0)
MONO#: 0 10*3/uL — AB (ref 0.1–0.9)
MONO%: 0 % (ref 0.0–14.0)
NEUT#: 0 10*3/uL — CL (ref 1.5–6.5)
NEUT%: 4.4 % — AB (ref 38.4–76.8)
PLATELETS: 29 10*3/uL — AB (ref 145–400)
RBC: 3.03 10*6/uL — AB (ref 3.70–5.45)
RDW: 14.9 % — ABNORMAL HIGH (ref 11.2–14.5)
WBC: 0.2 10*3/uL — CL (ref 3.9–10.3)
nRBC: 0 % (ref 0–0)

## 2016-09-01 LAB — PREPARE RBC (CROSSMATCH)

## 2016-09-01 LAB — ABO/RH: ABO/RH(D): A POS

## 2016-09-01 LAB — MAGNESIUM: MAGNESIUM: 2.2 mg/dL (ref 1.5–2.5)

## 2016-09-01 MED ORDER — ACETAMINOPHEN 325 MG PO TABS
650.0000 mg | ORAL_TABLET | Freq: Once | ORAL | Status: AC
  Administered 2016-09-01: 650 mg via ORAL

## 2016-09-01 MED ORDER — DIPHENHYDRAMINE HCL 25 MG PO CAPS
ORAL_CAPSULE | ORAL | Status: AC
  Filled 2016-09-01: qty 1

## 2016-09-01 MED ORDER — SODIUM CHLORIDE 0.9 % IJ SOLN
10.0000 mL | INTRAMUSCULAR | Status: DC | PRN
  Filled 2016-09-01: qty 10

## 2016-09-01 MED ORDER — PEGFILGRASTIM INJECTION 6 MG/0.6ML
6.0000 mg | Freq: Once | SUBCUTANEOUS | Status: AC
  Administered 2016-09-01: 6 mg via SUBCUTANEOUS
  Filled 2016-09-01: qty 0.6

## 2016-09-01 MED ORDER — DIPHENHYDRAMINE HCL 25 MG PO CAPS
25.0000 mg | ORAL_CAPSULE | Freq: Once | ORAL | Status: AC
  Administered 2016-09-01: 25 mg via ORAL

## 2016-09-01 MED ORDER — SODIUM CHLORIDE 0.9 % IV SOLN
250.0000 mL | Freq: Once | INTRAVENOUS | Status: AC
  Administered 2016-09-01: 250 mL via INTRAVENOUS

## 2016-09-01 MED ORDER — ACETAMINOPHEN 325 MG PO TABS
ORAL_TABLET | ORAL | Status: AC
  Filled 2016-09-01: qty 2

## 2016-09-01 MED ORDER — HEPARIN SOD (PORK) LOCK FLUSH 100 UNIT/ML IV SOLN
500.0000 [IU] | INTRAVENOUS | Status: AC | PRN
  Administered 2016-09-01: 500 [IU]
  Filled 2016-09-01: qty 5

## 2016-09-01 NOTE — Patient Instructions (Signed)
Blood Transfusion , Adult A blood transfusion is a procedure in which you receive donated blood, including plasma, platelets, and red blood cells, through an IV tube. You may need a blood transfusion because of illness, surgery, or injury. The blood may come from a donor. You may also be able to donate blood for yourself (autologous blood donation) before a surgery if you know that you might require a blood transfusion. The blood given in a transfusion is made up of different types of cells. You may receive:  Red blood cells. These carry oxygen to the cells in the body.  White blood cells. These help you fight infections.  Platelets. These help your blood to clot.  Plasma. This is the liquid part of your blood and it helps with fluid imbalances. If you have hemophilia or another clotting disorder, you may also receive other types of blood products. Tell a health care provider about:  Any allergies you have.  All medicines you are taking, including vitamins, herbs, eye drops, creams, and over-the-counter medicines.  Any problems you or family members have had with anesthetic medicines.  Any blood disorders you have.  Any surgeries you have had.  Any medical conditions you have, including any recent fever or cold symptoms.  Whether you are pregnant or may be pregnant.  Any previous reactions you have had during a blood transfusion. What are the risks? Generally, this is a safe procedure. However, problems may occur, including:  Having an allergic reaction to something in the donated blood. Hives and itching may be symptoms of this type of reaction.  Fever. This may be a reaction to the white blood cells in the transfused blood. Nausea or chest pain may accompany a fever.  Iron overload. This can happen from having many transfusions.  Transfusion-related acute lung injury (TRALI). This is a rare reaction that causes lung damage. The cause is not known.TRALI can occur within hours  of a transfusion or several days later.  Sudden (acute) or delayed hemolytic reactions. This happens if your blood does not match the cells in your transfusion. Your body's defense system (immune system) may try to attack the new cells. This complication is rare. The symptoms include fever, chills, nausea, and low back pain or chest pain.  Infection or disease transmission. This is rare. What happens before the procedure?  You will have a blood test to determine your blood type. This is necessary to know what kind of blood your body will accept and to match it to the donor blood.  If you are going to have a planned surgery, you may be able to do an autologous blood donation. This may be done in case you need to have a transfusion.  If you have had an allergic reaction to a transfusion in the past, you may be given medicine to help prevent a reaction. This medicine may be given to you by mouth or through an IV tube.  You will have your temperature, blood pressure, and pulse monitored before the transfusion.  Follow instructions from your health care provider about eating and drinking restrictions.  Ask your health care provider about:  Changing or stopping your regular medicines. This is especially important if you are taking diabetes medicines or blood thinners.  Taking medicines such as aspirin and ibuprofen. These medicines can thin your blood. Do not take these medicines before your procedure if your health care provider instructs you not to. What happens during the procedure?  An IV tube will be   inserted into one of your veins.  The bag of donated blood will be attached to your IV tube. The blood will then enter through your vein.  Your temperature, blood pressure, and pulse will be monitored regularly during the transfusion. This monitoring is done to detect early signs of a transfusion reaction.  If you have any signs or symptoms of a reaction, your transfusion will be stopped and  you may be given medicine.  When the transfusion is complete, your IV tube will be removed.  Pressure may be applied to the IV site for a few minutes.  A bandage (dressing) will be applied. The procedure may vary among health care providers and hospitals. What happens after the procedure?  Your temperature, blood pressure, heart rate, breathing rate, and blood oxygen level will be monitored often.  Your blood may be tested to see how you are responding to the transfusion.  You may be warmed with fluids or blankets to maintain a normal body temperature. Summary  A blood transfusion is a procedure in which you receive donated blood, including plasma, platelets, and red blood cells, through an IV tube.  Your temperature, blood pressure, and pulse will be monitored before, during, and after the transfusion.  Your blood may be tested after the transfusion to see how your body has responded. This information is not intended to replace advice given to you by your health care provider. Make sure you discuss any questions you have with your health care provider. Document Released: 10/02/2000 Document Revised: 07/02/2016 Document Reviewed: 07/02/2016 Elsevier Interactive Patient Education  2017 [employer]. Platelet Transfusion Introduction A platelet transfusion is a procedure in which you receive donated platelets through an IV tube. Platelets are tiny pieces of blood cells. When a blood vessel is damaged, platelets collect in the damaged area to help form a blood clot. This begins the healing process. If your platelet count gets too low, your blood may have trouble clotting. You may need a platelet transfusion if you have a condition that causes a low number of platelets (thrombocytopenia). A platelet transfusion may be used to stop or prevent bleeding. Tell a health care provider about:  Any allergies you have.  All medicines you are taking, including vitamins, herbs, eye drops, creams,  and over-the-counter medicines.  Any problems you or family members have had with anesthetic medicines.  Any blood disorders you have.  Any surgeries you have had.  Any medical conditions you have.  Any reactions you have had during a previous transfusion. What are the risks? Generally, this is a safe procedure. However, problems may occur, including:  Fever with or without chills. The fever usually occurs within the first 4 hours of the transfusion and returns to normal within 48 hours.  Allergic reaction. The reaction is most commonly caused by antibodies your body creates against substances in the transfusion. Signs of an allergic reaction may include itching, hives, difficulty breathing, shock, or low blood pressure.  Sudden (acute) or delayed hemolytic reaction. This rare reaction can occur during the transfusion and up to 28 days after the transfusion. The reaction usually occurs when your body's defense system (immune system) attacks the new platelets. Signs of a hemolytic reaction may include fever, headache, difficulty breathing, low blood pressure, a rapid heartbeat, or pain in your back, abdomen, chest, or IV site.  Transfusion-related acute lung injury (TRALI). TRALI can occur within hours of a transfusion, or several days later. This is a rare reaction that causes lung damage. The  cause is not known.  Infection. Signs of this rare complication may include fever, chills, vomiting, a rapid heartbeat, or low blood pressure. What happens before the procedure?  You may have a blood test to determine your blood type. This is necessary to find out what kind ofplatelets best matches your platelets.  If you have had an allergic reaction to a transfusion in the past, you may be given medicine to help prevent a reaction. Take this medicine only as directed by your health care provider.  Your temperature, blood pressure, and pulse will be monitored before the transfusion. What happens  during the procedure?  An IV will be started in your hand or arm.  The transfusion will be attached to your IV tubing. The bag of donated platelets will be attached to your IV tube andgiven into your vein.  Your temperature, blood pressure, and pulse will be monitored regularly during the transfusion. This monitoring is done to help detect early signs of a transfusion reaction.  If you have any signs or symptoms of a reaction, your transfusion will be stopped and you may be given medicine.  When your transfusion is complete, your IV will be removed.  Pressure may be applied to the IV site for a few minutes.  A bandage (dressing) will be applied. The procedure may vary among health care providers and hospitals. What happens after the procedure?  Your blood pressure, temperature, and pulse will be monitored regularly. This information is not intended to replace advice given to you by your health care provider. Make sure you discuss any questions you have with your health care provider. Document Released: 08/02/2007 Document Revised: 03/12/2016 Document Reviewed: 08/15/2014  2017 Elsevier

## 2016-09-01 NOTE — Telephone Encounter (Signed)
Message sent to infusion scheduler to be added.  Appointments scheduled per 09/01/16 los.  Copy of  AVS report and appointment schedule was given to patient, per 09/01/16 los.

## 2016-09-01 NOTE — Telephone Encounter (Signed)
Per LOS I have scheduled appts and notified the scheduler 

## 2016-09-01 NOTE — Telephone Encounter (Signed)
Labs faxed to [facility] 

## 2016-09-02 DIAGNOSIS — K649 Unspecified hemorrhoids: Secondary | ICD-10-CM | POA: Insufficient documentation

## 2016-09-02 LAB — TYPE AND SCREEN
ABO/RH(D): A POS
ANTIBODY SCREEN: NEGATIVE
Unit division: 0

## 2016-09-02 LAB — PREPARE PLATELET PHERESIS: UNIT DIVISION: 0

## 2016-09-02 NOTE — Assessment & Plan Note (Signed)
The patient complained of severe fatigue and hemorrhoidal plain since recent discharge from the hospital. She denies fever or chills. She have occasional hemorrhoidal bleeding but not severe. I will proceed to give her transfusion as directed by her oncologist at [facility] and will also give her G-CSF today.

## 2016-09-02 NOTE — Progress Notes (Signed)
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)   06/30/2016 Procedure    She had excisional lymph node biopsy in her neck      06/30/2016 Pathology Results    Accession: [id number] biopsy from neck showed DLBCB      07/03/2016 Bone Marrow Biopsy    She had bone marrow biopsy at [facility] which showed high-grade B-cell lymphoma, with MYC and BCL2 and BCL6 translocations extensively involving a hypercellular bone marrow (70-80%) with mild megakaryocytic hyperplasia and normal erythropoiesis and myelopoiesis.      07/06/2016 - 07/31/2016 Chemotherapy    The patient received several cycles of R-EPOCH at [facility]      07/27/2016 Procedure    She had successful placement of a port-catheter with tip terminating in the right atrium      08/03/2016 Imaging    Venous Doppler ultrasound of the right lower extremity excluded DVT      08/13/2016 PET scan    She underwent PET CT scan and at [facility] which showed Hypermetabolic left level 1b lymph node measuring 1.8 x 1.5 cm in axial dimension (image 32) with a maximal SUV of 8.4. Hypermetabolic left level 5B lymph node measuring 0.9 x 0.6 cm in axial dimension (image 51) with a maximal SUV of 5.8. She has no other lymphadenopathy elsewhere      08/28/2016 - 08/31/2016 Hospital Admission    The patient received chemotherapy at [facility] for cycle 1 of CODOX-M           INTERVAL HISTORY: Please see below for problem oriented charting. She returns today for supportive care. She is due for methotrexate intrathecal injection on November 15th She complained of severe hemorrhoidal pain and bleeding recently Denies recent fever or chills. No cough. Denies sore throat. No recent nausea or vomiting. She complained of mild fatigue.  She appears anxious  REVIEW OF SYSTEMS:   Constitutional: Denies fevers, chills or abnormal weight loss Eyes: Denies blurriness of vision Ears, nose, mouth, throat, and face: Denies mucositis or sore throat Respiratory: Denies cough, dyspnea or wheezes Cardiovascular: Denies palpitation, chest discomfort or lower extremity swelling Gastrointestinal:  Denies nausea, heartburn or change in bowel habits Skin: Denies abnormal skin rashes Lymphatics: Denies new lymphadenopathy or easy bruising Neurological:Denies numbness, tingling or new weaknesses Behavioral/Psych: Mood is stable, no new changes  All other systems were reviewed with the patient and are negative.  I have reviewed the past medical history, past surgical history, social history and family history with the patient and they are unchanged from previous note.  ALLERGIES:  is allergic to codeine and tramadol.  MEDICATIONS:  Current Outpatient Prescriptions  Medication Sig Dispense Refill  . acyclovir (ZOVIRAX) 400 MG tablet Take 1 tablet by mouth twice daily while neutropenic. Your physician will instruct you when to stop taking.    . docusate sodium (COLACE) 100 MG capsule Take 100 mg by mouth daily as needed for mild constipation.    . fluconazole (DIFLUCAN) 200 MG tablet Take 1 tablet by mouth once daily while neutropenic. Your physician will instruct you when to stop taking.    Marland Kitchen levofloxacin (LEVAQUIN) 500 MG tablet Take 1 tablet by mouth once daily while neutropenic. Your physician will instruct you when to stop taking.    . lidocaine-prilocaine (EMLA) cream APPLY A  SMALL AMOUNT OVER PAC 30-45 MINUTES PRIOR TO ACCESS. DO NOT USE IF DERMABOND STILL PRESENT.  2  . loratadine (CLARITIN) 10 MG tablet Take 10 mg by mouth daily as needed for allergies.    Marland Kitchen LORazepam (ATIVAN) 0.5 MG tablet Take 1 tablet (0.5 mg total) by mouth every 8 (eight) hours. (Patient taking differently: Take 0.5 mg by mouth every 8 (eight) hours as needed. )  30 tablet 0  . ondansetron (ZOFRAN) 8 MG tablet Take 8 mg by mouth.    . polyethylene glycol (MIRALAX / GLYCOLAX) packet Take 17 g by mouth daily.    Marland Kitchen sulfamethoxazole-trimethoprim (BACTRIM DS,SEPTRA DS) 800-160 MG tablet Take 1 tablet by mouth 3 (three) times a week.    Marland Kitchen acetaminophen (TYLENOL) 325 MG tablet Take 650 mg by mouth every 6 (six) hours as needed for moderate pain or headache.     . promethazine (PHENERGAN) 25 MG tablet Take 1 tablet (25 mg total) by mouth every 6 (six) hours as needed for nausea or vomiting. (Patient not taking: Reported on 09/01/2016) 60 tablet 1   No current facility-administered medications for this visit.     PHYSICAL EXAMINATION: ECOG PERFORMANCE STATUS: 1 - Symptomatic but completely ambulatory  Vitals:   09/01/16 1033  BP: 134/80  Pulse: 89  Resp: 18  Temp: 98.1 F (36.7 C)   Filed Weights   09/01/16 1033  Weight: 143 lb 2 oz (64.9 kg)    GENERAL:alert, no distress and comfortable. She appears anxious SKIN: skin color, texture, turgor are normal, no rashes or significant lesions EYES: normal, Conjunctiva are pink and non-injected, sclera clear OROPHARYNX:no exudate, no erythema and lips, buccal mucosa, and tongue normal  NECK: supple, thyroid normal size, non-tender, without nodularity LYMPH:  no palpable lymphadenopathy in the cervical, axillary or inguinal LUNGS: clear to auscultation and percussion with normal breathing effort HEART: regular rate & rhythm and no murmurs and no lower extremity edema ABDOMEN:abdomen soft, non-tender and normal bowel sounds Musculoskeletal:no cyanosis of digits and no clubbing  NEURO: alert & oriented x 3 with fluent speech, no focal motor/sensory deficits  LABORATORY DATA:  I have reviewed the data as listed    Component Value Date/Time   NA 140 09/01/2016 0928   K 3.4 (L) 09/01/2016 0928   CL 108 06/22/2016 0938   CO2 24 09/01/2016 0928   GLUCOSE 94 09/01/2016 0928   BUN 14.1 09/01/2016 0928    CREATININE 0.7 09/01/2016 0928   CALCIUM 9.0 09/01/2016 0928   PROT 5.6 (L) 09/01/2016 0928   ALBUMIN 3.6 09/01/2016 0928   AST 13 09/01/2016 0928   ALT 16 09/01/2016 0928   ALKPHOS 51 09/01/2016 0928   BILITOT 1.63 (H) 09/01/2016 0928   GFRNONAA >60 06/22/2016 0938   GFRAA >60 06/22/2016 0938    No results found for: SPEP, UPEP  Lab Results  Component Value Date   WBC 0.2 (LL) 09/01/2016   NEUTROABS 0.0 (LL) 09/01/2016   HGB 8.5 (L) 09/01/2016   HCT 24.1 (L) 09/01/2016   MCV 79.5 09/01/2016   PLT 29 (L) 09/01/2016      Chemistry      Component Value Date/Time   NA 140 09/01/2016 0928   K 3.4 (L) 09/01/2016 0928   CL 108 06/22/2016 0938   CO2 24 09/01/2016 0928   BUN 14.1 09/01/2016 0928   CREATININE 0.7 09/01/2016 0928      Component Value Date/Time   CALCIUM 9.0 09/01/2016 0928   ALKPHOS 51 09/01/2016 0928  AST 13 09/01/2016 0928   ALT 16 09/01/2016 0928   BILITOT 1.63 (H) 09/01/2016 0928      ASSESSMENT & PLAN:  DLBCL (diffuse large B cell lymphoma) ([facility]) The patient complained of severe fatigue and hemorrhoidal plain since recent discharge from the hospital. She denies fever or chills. She have occasional hemorrhoidal bleeding but not severe. I will proceed to give her transfusion as directed by her oncologist at [place] and will also give her G-CSF today.  Pancytopenia, acquired [facility]) This is likely due to recent treatment. The patient denies recent history of bleeding such as epistaxis, hematuria or hematochezia. She is asymptomatic from the low platelet count and neutropenia. She will receive G-CSF today as directed I will proceed to give her 1 unit of blood transfusion and 1 unit of platelets as directed   Bleeding hemorrhoid She has recent hemorrhoidal pain and bleeding. We discussed conservative management with proctoscopy foam and sitz bath. I recommend avoiding suppository or preparation H if possible We also discussed increasing fruit and  dietary fiber   No orders of the defined types were placed in this encounter.  All questions were answered. The patient knows to call the clinic with any problems, questions or concerns. No barriers to learning was detected. I spent 20 minutes counseling the patient face to face. The total time spent in the appointment was 25 minutes and more than 50% was on counseling and review of test results     Heath Lark, MD 09/02/2016 12:44 PM

## 2016-09-02 NOTE — Assessment & Plan Note (Signed)
She has recent hemorrhoidal pain and bleeding. We discussed conservative management with proctoscopy foam and sitz bath. I recommend avoiding suppository or preparation H if possible We also discussed increasing fruit and dietary fiber

## 2016-09-02 NOTE — Assessment & Plan Note (Signed)
This is likely due to recent treatment. The patient denies recent history of bleeding such as epistaxis, hematuria or hematochezia. She is asymptomatic from the low platelet count and neutropenia. She will receive G-CSF today as directed I will proceed to give her 1 unit of blood transfusion and 1 unit of platelets as directed

## 2016-09-04 ENCOUNTER — Telehealth: Payer: Self-pay | Admitting: *Deleted

## 2016-09-04 ENCOUNTER — Other Ambulatory Visit: Payer: Managed Care, Other (non HMO)

## 2016-09-04 NOTE — Telephone Encounter (Signed)
Pt has not shown up for lab/ possible transfusion appt today.  Called pt and LVM on home and cell phone asking her to call us to let us know if she is ok and if she is coming

## 2016-09-08 ENCOUNTER — Other Ambulatory Visit: Payer: Managed Care, Other (non HMO)

## 2016-09-08 ENCOUNTER — Telehealth: Payer: Self-pay | Admitting: *Deleted

## 2016-09-08 NOTE — Telephone Encounter (Signed)
Chakera left a message stating she has been in the hospital at [facility] since last week. Cancelling today's appts

## 2016-09-11 ENCOUNTER — Other Ambulatory Visit: Payer: Managed Care, Other (non HMO)

## 2016-09-11 ENCOUNTER — Ambulatory Visit: Payer: Managed Care, Other (non HMO)

## 2016-09-11 ENCOUNTER — Telehealth: Payer: Self-pay | Admitting: *Deleted

## 2016-09-11 NOTE — Telephone Encounter (Signed)
Called patient regarding missed lab appt today. Pt states she forgot to call. She was discharged from [facility] yesterday. Did not need labs checked

## 2016-09-18 ENCOUNTER — Ambulatory Visit (HOSPITAL_COMMUNITY)
Admission: RE | Admit: 2016-09-18 | Discharge: 2016-09-18 | Disposition: A | Discharge status: Home / Self Care | Payer: Managed Care, Other (non HMO) | Source: Ambulatory Visit | Attending: Hematology and Oncology | Admitting: Hematology and Oncology

## 2016-09-18 DIAGNOSIS — D61818 Other pancytopenia: Secondary | ICD-10-CM | POA: Insufficient documentation

## 2016-09-22 ENCOUNTER — Other Ambulatory Visit: Payer: Managed Care, Other (non HMO)

## 2016-09-22 ENCOUNTER — Ambulatory Visit: Payer: Managed Care, Other (non HMO)

## 2016-09-22 ENCOUNTER — Ambulatory Visit: Payer: Managed Care, Other (non HMO) | Admitting: Hematology and Oncology

## 2016-09-24 ENCOUNTER — Telehealth: Payer: Self-pay | Admitting: *Deleted

## 2016-09-24 ENCOUNTER — Other Ambulatory Visit: Payer: Self-pay | Admitting: Hematology and Oncology

## 2016-09-24 NOTE — Telephone Encounter (Signed)
Call from Starling Manns, RN at [facility] to confirm pt's schedule here for next week and request lab/transfusion appts for the next several weeks.  She faxed over orders/ request for lab/ neulasta and transfusion appts.  Copy held at desk and given to Dr. Alvy Bimler.  Dr. Alvy Bimler sent scheduling message to add appts.

## 2016-09-25 ENCOUNTER — Other Ambulatory Visit: Payer: Managed Care, Other (non HMO)

## 2016-09-28 ENCOUNTER — Ambulatory Visit (HOSPITAL_BASED_OUTPATIENT_CLINIC_OR_DEPARTMENT_OTHER): Payer: Managed Care, Other (non HMO) | Admitting: Hematology and Oncology

## 2016-09-28 ENCOUNTER — Ambulatory Visit: Payer: Managed Care, Other (non HMO)

## 2016-09-28 ENCOUNTER — Other Ambulatory Visit: Payer: Self-pay | Admitting: Hematology and Oncology

## 2016-09-28 ENCOUNTER — Other Ambulatory Visit (HOSPITAL_BASED_OUTPATIENT_CLINIC_OR_DEPARTMENT_OTHER): Payer: Managed Care, Other (non HMO)

## 2016-09-28 VITALS — BP 117/77 | HR 98 | Temp 98.2°F | Resp 18 | Ht 66.0 in | Wt 137.8 lb

## 2016-09-28 DIAGNOSIS — D61818 Other pancytopenia: Secondary | ICD-10-CM

## 2016-09-28 DIAGNOSIS — C8338 Diffuse large B-cell lymphoma, lymph nodes of multiple sites: Secondary | ICD-10-CM | POA: Diagnosis not present

## 2016-09-28 DIAGNOSIS — K1231 Oral mucositis (ulcerative) due to antineoplastic therapy: Secondary | ICD-10-CM | POA: Diagnosis not present

## 2016-09-28 LAB — CBC WITH DIFFERENTIAL/PLATELET
BASO%: 0.2 % (ref 0.0–2.0)
Basophils Absolute: 0 10*3/uL (ref 0.0–0.1)
EOS%: 0.2 % (ref 0.0–7.0)
Eosinophils Absolute: 0 10*3/uL (ref 0.0–0.5)
HCT: 29.4 % — ABNORMAL LOW (ref 34.8–46.6)
HGB: 10 g/dL — ABNORMAL LOW (ref 11.6–15.9)
LYMPH%: 5.6 % — AB (ref 14.0–49.7)
MCH: 29.7 pg (ref 25.1–34.0)
MCHC: 33.9 g/dL (ref 31.5–36.0)
MCV: 87.7 fL (ref 79.5–101.0)
MONO#: 0 10*3/uL — AB (ref 0.1–0.9)
MONO%: 0.1 % (ref 0.0–14.0)
NEUT#: 1.8 10*3/uL (ref 1.5–6.5)
NEUT%: 93.9 % — AB (ref 38.4–76.8)
PLATELETS: 53 10*3/uL — AB (ref 145–400)
RBC: 3.35 10*6/uL — AB (ref 3.70–5.45)
RDW: 19.8 % — ABNORMAL HIGH (ref 11.2–14.5)
WBC: 1.9 10*3/uL — ABNORMAL LOW (ref 3.9–10.3)
lymph#: 0.1 10*3/uL — ABNORMAL LOW (ref 0.9–3.3)

## 2016-09-28 LAB — COMPREHENSIVE METABOLIC PANEL
ALBUMIN: 3.7 g/dL (ref 3.5–5.0)
ALK PHOS: 52 U/L (ref 40–150)
ALT: 46 U/L (ref 0–55)
AST: 27 U/L (ref 5–34)
Anion Gap: 10 mEq/L (ref 3–11)
BILIRUBIN TOTAL: 0.46 mg/dL (ref 0.20–1.20)
BUN: 15.8 mg/dL (ref 7.0–26.0)
CO2: 25 mEq/L (ref 22–29)
CREATININE: 0.7 mg/dL (ref 0.6–1.1)
Calcium: 9.2 mg/dL (ref 8.4–10.4)
Chloride: 103 mEq/L (ref 98–109)
EGFR: >90 mL/min/{1.73_m2} (ref >90)
GLUCOSE: 115 mg/dL (ref 70–140)
POTASSIUM: 3.4 meq/L — AB (ref 3.5–5.1)
SODIUM: 139 meq/L (ref 136–145)
TOTAL PROTEIN: 6.4 g/dL (ref 6.4–8.3)

## 2016-09-28 LAB — TECHNOLOGIST REVIEW: TECHNOLOGIST REVIEW: NONE SEEN

## 2016-09-28 LAB — MAGNESIUM: Magnesium: 2.2 mg/dl (ref 1.5–2.5)

## 2016-09-28 MED ORDER — PEGFILGRASTIM INJECTION 6 MG/0.6ML
6.0000 mg | Freq: Once | SUBCUTANEOUS | Status: AC
  Administered 2016-09-28: 6 mg via SUBCUTANEOUS
  Filled 2016-09-28: qty 0.6

## 2016-09-28 NOTE — Assessment & Plan Note (Addendum)
The patient complained of severe fatigue since recent discharge from the hospital. She denies fever or chills.  I will proceed to give her transfusion as directed by her oncologist at [facility] and will also give her G-CSF today.

## 2016-09-28 NOTE — Assessment & Plan Note (Addendum)
This is likely due to recent treatment. The patient denies recent history of bleeding such as epistaxis, hematuria or hematochezia. She is asymptomatic from the pancytopenia. She will receive G-CSF today as directed  She does not need transfusion today I will proceed to give her 2 unit of blood transfusion if hemoglobin is less than 8 and and 1 unit of platelets if platelet count is less than 20,000. She would need irradiated blood products

## 2016-09-28 NOTE — Progress Notes (Unsigned)
Neulasta injection administered by Collaborative RN in MD exam room.

## 2016-09-28 NOTE — Progress Notes (Signed)
[facility] OFFICE PROGRESS NOTE  Patient Care Team: Devra Dopp, MD as PCP - General (Family Medicine) Ermalinda Barrios, MD as Consulting Physician (Otolaryngology)  SUMMARY OF ONCOLOGIC HISTORY:   DLBCL (diffuse large B cell lymphoma) (HCC)   06/30/2016 Procedure    She had excisional lymph node biopsy in her neck      06/30/2016 Pathology Results    Accession: [id number] biopsy from neck showed DLBCB      07/03/2016 Bone Marrow Biopsy    She had bone marrow biopsy at [facility] which showed high-grade B-cell lymphoma, with MYC and BCL2 and BCL6 translocations extensively involving a hypercellular bone marrow (70-80%) with mild megakaryocytic hyperplasia and normal erythropoiesis and myelopoiesis.      07/06/2016 - 07/31/2016 Chemotherapy    The patient received several cycles of R-EPOCH at [facility]      07/27/2016 Procedure    She had successful placement of a port-catheter with tip terminating in the right atrium      08/03/2016 Imaging    Venous Doppler ultrasound of the right lower extremity excluded DVT      08/13/2016 PET scan    She underwent PET CT scan and at [facility] which showed Hypermetabolic left level 1b lymph node measuring 1.8 x 1.5 cm in axial dimension (image 32) with a maximal SUV of 8.4. Hypermetabolic left level 5B lymph node measuring 0.9 x 0.6 cm in axial dimension (image 51) with a maximal SUV of 5.8. She has no other lymphadenopathy elsewhere      08/28/2016 - 08/31/2016 Hospital Admission    The patient received chemotherapy at [facility] for cycle 1 of CODOX-M          09/22/2016 - 09/27/2016 Hospital Admission    She was admitted to [facility] with intrathecal methotrexate on 09/22/16.           INTERVAL HISTORY: Please see below for problem oriented charting. She complained of poor appetite, recent dizziness and mild mucositis. No recent nausea or vomiting. The patient denies any recent signs or symptoms of  bleeding such as spontaneous epistaxis, hematuria or hematochezia.  REVIEW OF SYSTEMS:   Constitutional: Denies fevers, chills or abnormal weight loss Eyes: Denies blurriness of vision Respiratory: Denies cough, dyspnea or wheezes Cardiovascular: Denies palpitation, chest discomfort or lower extremity swelling Gastrointestinal:  Denies nausea, heartburn or change in bowel habits Skin: Denies abnormal skin rashes Lymphatics: Denies new lymphadenopathy or easy bruising Neurological:Denies numbness, tingling or new weaknesses Behavioral/Psych: Mood is stable, no new changes  All other systems were reviewed with the patient and are negative.  I have reviewed the past medical history, past surgical history, social history and family history with the patient and they are unchanged from previous note.  ALLERGIES:  is allergic to codeine and tramadol.  MEDICATIONS:  Current Outpatient Prescriptions  Medication Sig Dispense Refill  . acetaminophen (TYLENOL) 325 MG tablet Take 650 mg by mouth every 6 (six) hours as needed for moderate pain or headache.     Marland Kitchen acyclovir (ZOVIRAX) 400 MG tablet Take 1 tablet by mouth twice daily while neutropenic. Your physician will instruct you when to stop taking.    . docusate sodium (COLACE) 100 MG capsule Take 100 mg by mouth daily as needed for mild constipation.    . fluconazole (DIFLUCAN) 200 MG tablet Take 1 tablet by mouth once daily while neutropenic. Your physician will instruct you when to stop taking.    Marland Kitchen levofloxacin (LEVAQUIN) 500  MG tablet Take 1 tablet by mouth once daily while neutropenic. Your physician will instruct you when to stop taking.    . lidocaine-prilocaine (EMLA) cream APPLY A SMALL AMOUNT OVER PAC 30-45 MINUTES PRIOR TO ACCESS. DO NOT USE IF DERMABOND STILL PRESENT.  2  . loratadine (CLARITIN) 10 MG tablet Take 10 mg by mouth daily as needed for allergies.    Marland Kitchen LORazepam (ATIVAN) 0.5 MG tablet Take 1 tablet (0.5 mg total) by mouth  every 8 (eight) hours. (Patient taking differently: Take 0.5 mg by mouth every 8 (eight) hours as needed. ) 30 tablet 0  . ondansetron (ZOFRAN) 8 MG tablet Take 8 mg by mouth.    . promethazine (PHENERGAN) 25 MG tablet Take 1 tablet (25 mg total) by mouth every 6 (six) hours as needed for nausea or vomiting. 60 tablet 1  . senna (SENOKOT) 8.6 MG TABS tablet Take 1 tablet by mouth daily as needed for mild constipation.    . sulfamethoxazole-trimethoprim (BACTRIM DS,SEPTRA DS) 800-160 MG tablet Take 1 tablet by mouth 3 (three) times a week.     No current facility-administered medications for this visit.     PHYSICAL EXAMINATION: ECOG PERFORMANCE STATUS: 1 - Symptomatic but completely ambulatory  Vitals:   09/28/16 1439  BP: 117/77  Pulse: 98  Resp: 18  Temp: 98.2 F (36.8 C)   Filed Weights   09/28/16 1439  Weight: 137 lb 12.8 oz (62.5 kg)    GENERAL:alert, no distress and comfortable SKIN: skin color, texture, turgor are normal, no rashes or significant lesions EYES: normal, Conjunctiva are pink and non-injected, sclera clear OROPHARYNX:no exudate, no erythema and lips, buccal mucosa, and tongue normal  NECK: supple, thyroid normal size, non-tender, without nodularity LYMPH:  no palpable lymphadenopathy in the cervical, axillary or inguinal LUNGS: clear to auscultation and percussion with normal breathing effort HEART: regular rate & rhythm and no murmurs and no lower extremity edema ABDOMEN:abdomen soft, non-tender and normal bowel sounds Musculoskeletal:no cyanosis of digits and no clubbing  NEURO: alert & oriented x 3 with fluent speech, no focal motor/sensory deficits  LABORATORY DATA:  I have reviewed the data as listed    Component Value Date/Time   NA 139 09/28/2016 1417   K 3.4 (L) 09/28/2016 1417   CL 108 06/22/2016 0938   CO2 25 09/28/2016 1417   GLUCOSE 115 09/28/2016 1417   BUN 15.8 09/28/2016 1417   CREATININE 0.7 09/28/2016 1417   CALCIUM 9.2 09/28/2016  1417   PROT 6.4 09/28/2016 1417   ALBUMIN 3.7 09/28/2016 1417   AST 27 09/28/2016 1417   ALT 46 09/28/2016 1417   ALKPHOS 52 09/28/2016 1417   BILITOT 0.46 09/28/2016 1417   GFRNONAA >60 06/22/2016 0938   GFRAA >60 06/22/2016 0938    No results found for: SPEP, UPEP  Lab Results  Component Value Date   WBC 1.9 (L) 09/28/2016   NEUTROABS 1.8 09/28/2016   HGB 10.0 (L) 09/28/2016   HCT 29.4 (L) 09/28/2016   MCV 87.7 09/28/2016   PLT 53 (L) 09/28/2016      Chemistry      Component Value Date/Time   NA 139 09/28/2016 1417   K 3.4 (L) 09/28/2016 1417   CL 108 06/22/2016 0938   CO2 25 09/28/2016 1417   BUN 15.8 09/28/2016 1417   CREATININE 0.7 09/28/2016 1417      Component Value Date/Time   CALCIUM 9.2 09/28/2016 1417   ALKPHOS 52 09/28/2016 1417   AST 27  09/28/2016 1417   ALT 46 09/28/2016 1417   BILITOT 0.46 09/28/2016 1417      ASSESSMENT & PLAN:  DLBCL (diffuse large B cell lymphoma) (HCC) The patient complained of severe fatigue since recent discharge from the hospital. She denies fever or chills.  I will proceed to give her transfusion as directed by her oncologist at [place] and will also give her G-CSF today.  Pancytopenia, acquired [facility]) This is likely due to recent treatment. The patient denies recent history of bleeding such as epistaxis, hematuria or hematochezia. She is asymptomatic from the pancytopenia. She will receive G-CSF today as directed  She does not need transfusion today I will proceed to give her 2 unit of blood transfusion if hemoglobin is less than 8 and and 1 unit of platelets if platelet count is less than 20,000. She would need irradiated blood products  Mucositis due to chemotherapy She had recent mucositis from chemotherapy. Examination is satisfactory without ulceration. Continue supportive care. She has pain medicine to take as needed   No orders of the defined types were placed in this encounter.  All questions were  answered. The patient knows to call the clinic with any problems, questions or concerns. No barriers to learning was detected. I spent 15 minutes counseling the patient face to face. The total time spent in the appointment was 20 minutes and more than 50% was on counseling and review of test results     Heath Lark, MD 09/29/2016 8:25 AM

## 2016-09-29 ENCOUNTER — Ambulatory Visit: Payer: 59 | Admitting: Hematology and Oncology

## 2016-09-29 ENCOUNTER — Other Ambulatory Visit: Payer: Managed Care, Other (non HMO)

## 2016-09-29 ENCOUNTER — Encounter: Payer: Self-pay | Admitting: Hematology and Oncology

## 2016-09-29 ENCOUNTER — Other Ambulatory Visit: Payer: 59

## 2016-09-29 DIAGNOSIS — K1231 Oral mucositis (ulcerative) due to antineoplastic therapy: Secondary | ICD-10-CM | POA: Insufficient documentation

## 2016-09-29 NOTE — Assessment & Plan Note (Signed)
She had recent mucositis from chemotherapy. Examination is satisfactory without ulceration. Continue supportive care. She has pain medicine to take as needed

## 2016-10-01 ENCOUNTER — Telehealth: Payer: Self-pay | Admitting: General Practice

## 2016-10-01 NOTE — Telephone Encounter (Signed)
Spoke with pt confirmed Jan 2018 appts. 

## 2016-10-02 ENCOUNTER — Ambulatory Visit (HOSPITAL_BASED_OUTPATIENT_CLINIC_OR_DEPARTMENT_OTHER): Payer: Managed Care, Other (non HMO)

## 2016-10-02 ENCOUNTER — Other Ambulatory Visit (HOSPITAL_BASED_OUTPATIENT_CLINIC_OR_DEPARTMENT_OTHER): Payer: Managed Care, Other (non HMO)

## 2016-10-02 ENCOUNTER — Ambulatory Visit: Payer: Managed Care, Other (non HMO)

## 2016-10-02 ENCOUNTER — Other Ambulatory Visit: Payer: Self-pay | Admitting: Hematology and Oncology

## 2016-10-02 DIAGNOSIS — D61818 Other pancytopenia: Secondary | ICD-10-CM

## 2016-10-02 DIAGNOSIS — C8338 Diffuse large B-cell lymphoma, lymph nodes of multiple sites: Secondary | ICD-10-CM

## 2016-10-02 LAB — CBC WITH DIFFERENTIAL/PLATELET
HEMATOCRIT: 22.3 % — AB (ref 34.8–46.6)
HEMOGLOBIN: 8.1 g/dL — AB (ref 11.6–15.9)
MCH: 30.2 pg (ref 25.1–34.0)
MCHC: 36.3 g/dL — ABNORMAL HIGH (ref 31.5–36.0)
MCV: 83.2 fL (ref 79.5–101.0)
PLATELETS: 5 10*3/uL — AB (ref 145–400)
RBC: 2.68 10*6/uL — ABNORMAL LOW (ref 3.70–5.45)
RDW: 16.3 % — ABNORMAL HIGH (ref 11.2–14.5)

## 2016-10-02 LAB — COMPREHENSIVE METABOLIC PANEL
ALK PHOS: 70 U/L (ref 40–150)
ALT: 18 U/L (ref 0–55)
ANION GAP: 7 meq/L (ref 3–11)
AST: 10 U/L (ref 5–34)
Albumin: 3.8 g/dL (ref 3.5–5.0)
BUN: 23 mg/dL (ref 7.0–26.0)
CALCIUM: 9.1 mg/dL (ref 8.4–10.4)
CHLORIDE: 107 meq/L (ref 98–109)
CO2: 24 mEq/L (ref 22–29)
Creatinine: 0.7 mg/dL (ref 0.6–1.1)
Glucose: 106 mg/dl (ref 70–140)
POTASSIUM: 4.3 meq/L (ref 3.5–5.1)
Sodium: 138 mEq/L (ref 136–145)
Total Bilirubin: 0.78 mg/dL (ref 0.20–1.20)
Total Protein: 6.3 g/dL — ABNORMAL LOW (ref 6.4–8.3)

## 2016-10-02 LAB — TECHNOLOGIST REVIEW

## 2016-10-02 LAB — PREPARE RBC (CROSSMATCH)

## 2016-10-02 LAB — MAGNESIUM: Magnesium: 2.4 mg/dl (ref 1.5–2.5)

## 2016-10-02 MED ORDER — SODIUM CHLORIDE 0.9 % IJ SOLN
10.0000 mL | INTRAMUSCULAR | Status: AC | PRN
  Administered 2016-10-02: 10 mL
  Filled 2016-10-02: qty 10

## 2016-10-02 MED ORDER — DIPHENHYDRAMINE HCL 25 MG PO CAPS
25.0000 mg | ORAL_CAPSULE | Freq: Once | ORAL | Status: AC
  Administered 2016-10-02: 25 mg via ORAL

## 2016-10-02 MED ORDER — ACETAMINOPHEN 325 MG PO TABS
650.0000 mg | ORAL_TABLET | Freq: Once | ORAL | Status: AC
  Administered 2016-10-02: 650 mg via ORAL

## 2016-10-02 MED ORDER — SODIUM CHLORIDE 0.9 % IV SOLN
250.0000 mL | Freq: Once | INTRAVENOUS | Status: AC
  Administered 2016-10-02: 250 mL via INTRAVENOUS

## 2016-10-02 MED ORDER — ACETAMINOPHEN 325 MG PO TABS
ORAL_TABLET | ORAL | Status: AC
  Filled 2016-10-02: qty 2

## 2016-10-02 MED ORDER — DIPHENHYDRAMINE HCL 25 MG PO CAPS
ORAL_CAPSULE | ORAL | Status: AC
  Filled 2016-10-02: qty 1

## 2016-10-02 MED ORDER — SODIUM CHLORIDE 0.9% FLUSH
10.0000 mL | INTRAVENOUS | Status: AC | PRN
  Administered 2016-10-02: 10 mL
  Filled 2016-10-02: qty 10

## 2016-10-02 MED ORDER — HEPARIN SOD (PORK) LOCK FLUSH 100 UNIT/ML IV SOLN
500.0000 [IU] | Freq: Every day | INTRAVENOUS | Status: AC | PRN
  Administered 2016-10-02: 500 [IU]
  Filled 2016-10-02: qty 5

## 2016-10-02 NOTE — Patient Instructions (Signed)
Blood Transfusion , Adult A blood transfusion is a procedure in which you receive donated blood, including plasma, platelets, and red blood cells, through an IV tube. You may need a blood transfusion because of illness, surgery, or injury. The blood may come from a donor. You may also be able to donate blood for yourself (autologous blood donation) before a surgery if you know that you might require a blood transfusion. The blood given in a transfusion is made up of different types of cells. You may receive:  Red blood cells. These carry oxygen to the cells in the body.  White blood cells. These help you fight infections.  Platelets. These help your blood to clot.  Plasma. This is the liquid part of your blood and it helps with fluid imbalances. If you have hemophilia or another clotting disorder, you may also receive other types of blood products. Tell a health care provider about:  Any allergies you have.  All medicines you are taking, including vitamins, herbs, eye drops, creams, and over-the-counter medicines.  Any problems you or family members have had with anesthetic medicines.  Any blood disorders you have.  Any surgeries you have had.  Any medical conditions you have, including any recent fever or cold symptoms.  Whether you are pregnant or may be pregnant.  Any previous reactions you have had during a blood transfusion. What are the risks? Generally, this is a safe procedure. However, problems may occur, including:  Having an allergic reaction to something in the donated blood. Hives and itching may be symptoms of this type of reaction.  Fever. This may be a reaction to the white blood cells in the transfused blood. Nausea or chest pain may accompany a fever.  Iron overload. This can happen from having many transfusions.  Transfusion-related acute lung injury (TRALI). This is a rare reaction that causes lung damage. The cause is not known.TRALI can occur within hours  of a transfusion or several days later.  Sudden (acute) or delayed hemolytic reactions. This happens if your blood does not match the cells in your transfusion. Your body's defense system (immune system) may try to attack the new cells. This complication is rare. The symptoms include fever, chills, nausea, and low back pain or chest pain.  Infection or disease transmission. This is rare. What happens before the procedure?  You will have a blood test to determine your blood type. This is necessary to know what kind of blood your body will accept and to match it to the donor blood.  If you are going to have a planned surgery, you may be able to do an autologous blood donation. This may be done in case you need to have a transfusion.  If you have had an allergic reaction to a transfusion in the past, you may be given medicine to help prevent a reaction. This medicine may be given to you by mouth or through an IV tube.  You will have your temperature, blood pressure, and pulse monitored before the transfusion.  Follow instructions from your health care provider about eating and drinking restrictions.  Ask your health care provider about:  Changing or stopping your regular medicines. This is especially important if you are taking diabetes medicines or blood thinners.  Taking medicines such as aspirin and ibuprofen. These medicines can thin your blood. Do not take these medicines before your procedure if your health care provider instructs you not to. What happens during the procedure?  An IV tube will be   inserted into one of your veins.  The bag of donated blood will be attached to your IV tube. The blood will then enter through your vein.  Your temperature, blood pressure, and pulse will be monitored regularly during the transfusion. This monitoring is done to detect early signs of a transfusion reaction.  If you have any signs or symptoms of a reaction, your transfusion will be stopped and  you may be given medicine.  When the transfusion is complete, your IV tube will be removed.  Pressure may be applied to the IV site for a few minutes.  A bandage (dressing) will be applied. The procedure may vary among health care providers and hospitals. What happens after the procedure?  Your temperature, blood pressure, heart rate, breathing rate, and blood oxygen level will be monitored often.  Your blood may be tested to see how you are responding to the transfusion.  You may be warmed with fluids or blankets to maintain a normal body temperature. Summary  A blood transfusion is a procedure in which you receive donated blood, including plasma, platelets, and red blood cells, through an IV tube.  Your temperature, blood pressure, and pulse will be monitored before, during, and after the transfusion.  Your blood may be tested after the transfusion to see how your body has responded. This information is not intended to replace advice given to you by your health care provider. Make sure you discuss any questions you have with your health care provider. Document Released: 10/02/2000 Document Revised: 07/02/2016 Document Reviewed: 07/02/2016 Elsevier Interactive Patient Education  2017 [employer]. Platelet Transfusion Introduction A platelet transfusion is a procedure in which you receive donated platelets through an IV tube. Platelets are tiny pieces of blood cells. When a blood vessel is damaged, platelets collect in the damaged area to help form a blood clot. This begins the healing process. If your platelet count gets too low, your blood may have trouble clotting. You may need a platelet transfusion if you have a condition that causes a low number of platelets (thrombocytopenia). A platelet transfusion may be used to stop or prevent bleeding. Tell a health care provider about:  Any allergies you have.  All medicines you are taking, including vitamins, herbs, eye drops, creams,  and over-the-counter medicines.  Any problems you or family members have had with anesthetic medicines.  Any blood disorders you have.  Any surgeries you have had.  Any medical conditions you have.  Any reactions you have had during a previous transfusion. What are the risks? Generally, this is a safe procedure. However, problems may occur, including:  Fever with or without chills. The fever usually occurs within the first 4 hours of the transfusion and returns to normal within 48 hours.  Allergic reaction. The reaction is most commonly caused by antibodies your body creates against substances in the transfusion. Signs of an allergic reaction may include itching, hives, difficulty breathing, shock, or low blood pressure.  Sudden (acute) or delayed hemolytic reaction. This rare reaction can occur during the transfusion and up to 28 days after the transfusion. The reaction usually occurs when your body's defense system (immune system) attacks the new platelets. Signs of a hemolytic reaction may include fever, headache, difficulty breathing, low blood pressure, a rapid heartbeat, or pain in your back, abdomen, chest, or IV site.  Transfusion-related acute lung injury (TRALI). TRALI can occur within hours of a transfusion, or several days later. This is a rare reaction that causes lung damage. The  cause is not known.  Infection. Signs of this rare complication may include fever, chills, vomiting, a rapid heartbeat, or low blood pressure. What happens before the procedure?  You may have a blood test to determine your blood type. This is necessary to find out what kind ofplatelets best matches your platelets.  If you have had an allergic reaction to a transfusion in the past, you may be given medicine to help prevent a reaction. Take this medicine only as directed by your health care provider.  Your temperature, blood pressure, and pulse will be monitored before the transfusion. What happens  during the procedure?  An IV will be started in your hand or arm.  The transfusion will be attached to your IV tubing. The bag of donated platelets will be attached to your IV tube andgiven into your vein.  Your temperature, blood pressure, and pulse will be monitored regularly during the transfusion. This monitoring is done to help detect early signs of a transfusion reaction.  If you have any signs or symptoms of a reaction, your transfusion will be stopped and you may be given medicine.  When your transfusion is complete, your IV will be removed.  Pressure may be applied to the IV site for a few minutes.  A bandage (dressing) will be applied. The procedure may vary among health care providers and hospitals. What happens after the procedure?  Your blood pressure, temperature, and pulse will be monitored regularly. This information is not intended to replace advice given to you by your health care provider. Make sure you discuss any questions you have with your health care provider. Document Released: 08/02/2007 Document Revised: 03/12/2016 Document Reviewed: 08/15/2014  2017 Elsevier

## 2016-10-02 NOTE — Progress Notes (Signed)
Per pt. She only receives one unit of prbc's when hgb is less than 8.  Verified this order with Cameo, RN and blood bank notified to only prepare one unit PRBCs and one unit PLT.    Pt. Also reports intermittent chest/epigastric pain with her SOB over the last couple days. Cameo, RN notified and will let Dr. Alvy Bimler know.

## 2016-10-05 ENCOUNTER — Telehealth: Payer: Self-pay | Admitting: *Deleted

## 2016-10-05 ENCOUNTER — Other Ambulatory Visit: Payer: Managed Care, Other (non HMO)

## 2016-10-05 LAB — PREPARE PLATELET PHERESIS: UNIT DIVISION: 0

## 2016-10-05 LAB — TYPE AND SCREEN
ABO/RH(D): A POS
Antibody Screen: NEGATIVE
Unit division: 0

## 2016-10-05 NOTE — Telephone Encounter (Signed)
Pt left VM states she is hospitalized at [facility] for Fever.  She thinks her PAC may be infected.  She has to cancel her appt for lab and transfusion tomorrow.  Will keep appt on Friday for now.  Appts canceled.

## 2016-10-06 ENCOUNTER — Other Ambulatory Visit: Payer: Managed Care, Other (non HMO)

## 2016-10-06 ENCOUNTER — Encounter (HOSPITAL_COMMUNITY): Payer: Managed Care, Other (non HMO)

## 2016-10-08 ENCOUNTER — Other Ambulatory Visit: Payer: Self-pay | Admitting: Hematology and Oncology

## 2016-10-08 NOTE — Telephone Encounter (Signed)
I do not think we can refill electronically?

## 2016-10-09 ENCOUNTER — Other Ambulatory Visit: Payer: Managed Care, Other (non HMO)

## 2016-10-13 ENCOUNTER — Other Ambulatory Visit: Payer: Managed Care, Other (non HMO)

## 2016-10-30 ENCOUNTER — Other Ambulatory Visit: Payer: Self-pay | Admitting: Hematology and Oncology

## 2016-10-30 ENCOUNTER — Telehealth: Payer: Self-pay | Admitting: *Deleted

## 2016-10-30 DIAGNOSIS — C8338 Diffuse large B-cell lymphoma, lymph nodes of multiple sites: Secondary | ICD-10-CM

## 2016-10-30 NOTE — Telephone Encounter (Signed)
Autumn [name] called to say she is hoping to begin a clinical trial in [place]. She needs to have an ECHO done prior to her appt in [place]. Has appts at [facility] on Monday 1/15, was wondering if we can get Echo scheduled for her Monday or Tuesday.   Does not feel she needs labs or port flush as they are going to probably remove port and place another catheter.  Autumn [name] reports she also has a blood clot high in her leg. Was discovered when she was being evaluated for one of the clinical trials- no symptoms.

## 2016-10-30 NOTE — Telephone Encounter (Signed)
I can order it but it will not likely be done by Tuesday. Also, most clinical trials cover a lot of preliminary testing; if she gets it done here, her insurance may not cover it. I will go ahead and order it I will also cancel labs and flush appt and infusion appt

## 2016-11-02 ENCOUNTER — Ambulatory Visit: Payer: Managed Care, Other (non HMO)

## 2016-11-02 ENCOUNTER — Telehealth: Payer: Self-pay | Admitting: *Deleted

## 2016-11-02 ENCOUNTER — Encounter: Payer: Managed Care, Other (non HMO) | Admitting: Nutrition

## 2016-11-02 ENCOUNTER — Other Ambulatory Visit: Payer: Managed Care, Other (non HMO)

## 2016-11-02 ENCOUNTER — Ambulatory Visit: Payer: Managed Care, Other (non HMO) | Admitting: Hematology and Oncology

## 2016-11-02 NOTE — Telephone Encounter (Signed)
Pt left VM on nurse's line.  She is actually in [place] now for the clinical trial.  She said everything happened so fast she forgot to cancel her appts here.  She was apologetic, and thanks Dr. Alvy Bimler for taking care of her.

## 2016-11-02 NOTE — Telephone Encounter (Signed)
LVM for pt on home and cell phone to check on her.  She did not show up for her appt w/ Dr. Alvy Bimler.  Asked her to call nurse back to see what she would like to do.  Also informed her I have not been able to schedule her Echo yet because I am waiting to see if it needs prior approval from her insurance company.

## 2016-11-05 ENCOUNTER — Telehealth: Payer: Self-pay | Admitting: *Deleted

## 2016-11-05 NOTE — Telephone Encounter (Signed)
Order from [facility] asking for Lab appt only on tomorrow 1/19, 1/22, 1/25 and 1/29 for CBC w/ diff.

## 2016-11-06 ENCOUNTER — Telehealth: Payer: Self-pay | Admitting: *Deleted

## 2016-11-06 ENCOUNTER — Other Ambulatory Visit: Payer: Self-pay | Admitting: Hematology and Oncology

## 2016-11-06 ENCOUNTER — Other Ambulatory Visit (HOSPITAL_BASED_OUTPATIENT_CLINIC_OR_DEPARTMENT_OTHER): Payer: Managed Care, Other (non HMO)

## 2016-11-06 ENCOUNTER — Other Ambulatory Visit: Payer: Self-pay | Admitting: *Deleted

## 2016-11-06 DIAGNOSIS — C9110 Chronic lymphocytic leukemia of B-cell type not having achieved remission: Secondary | ICD-10-CM

## 2016-11-06 DIAGNOSIS — C8338 Diffuse large B-cell lymphoma, lymph nodes of multiple sites: Secondary | ICD-10-CM

## 2016-11-06 LAB — CBC WITH DIFFERENTIAL/PLATELET
BASO%: 0 % (ref 0.0–2.0)
BASOS ABS: 0 10*3/uL (ref 0.0–0.1)
EOS%: 0 % (ref 0.0–7.0)
Eosinophils Absolute: 0 10*3/uL (ref 0.0–0.5)
HCT: 29.2 % — ABNORMAL LOW (ref 34.8–46.6)
HEMOGLOBIN: 9.9 g/dL — AB (ref 11.6–15.9)
LYMPH%: 1.2 % — ABNORMAL LOW (ref 14.0–49.7)
MCH: 32.9 pg (ref 25.1–34.0)
MCHC: 33.9 g/dL (ref 31.5–36.0)
MCV: 97 fL (ref 79.5–101.0)
MONO#: 0 10*3/uL — ABNORMAL LOW (ref 0.1–0.9)
MONO%: 0.1 % (ref 0.0–14.0)
NEUT#: 17 10*3/uL — ABNORMAL HIGH (ref 1.5–6.5)
NEUT%: 98.7 % — ABNORMAL HIGH (ref 38.4–76.8)
Platelets: 49 10*3/uL — ABNORMAL LOW (ref 145–400)
RBC: 3.01 10*6/uL — ABNORMAL LOW (ref 3.70–5.45)
RDW: 16.7 % — AB (ref 11.2–14.5)
WBC: 17.2 10*3/uL — AB (ref 3.9–10.3)
lymph#: 0.2 10*3/uL — ABNORMAL LOW (ref 0.9–3.3)

## 2016-11-06 NOTE — Telephone Encounter (Signed)
Since they are requesting CBC with diff only she does not need to see me OK to schedule labs: please place scheduling msg

## 2016-11-06 NOTE — Telephone Encounter (Signed)
Will have CBC at 1:30 today

## 2016-11-06 NOTE — Telephone Encounter (Signed)
-----   Message from Heath Lark, MD sent at 11/06/2016  1:40 PM EST ----- Regarding: CBC pls fax result to [facility] ----- Message ----- From: Interface, Lab In Three Zero One Sent: 11/06/2016   1:38 PM To: Heath Lark, MD

## 2016-11-06 NOTE — Telephone Encounter (Signed)
"  I have an order from [facility] for labs today.  They're needing results today.  Lovenox decision/prders need to be made today." Advised to expect a call from scheduling today.

## 2016-11-06 NOTE — Telephone Encounter (Signed)
Labs faxed to Dr Jacqulyn Liner office

## 2016-11-09 ENCOUNTER — Other Ambulatory Visit (HOSPITAL_BASED_OUTPATIENT_CLINIC_OR_DEPARTMENT_OTHER): Payer: Managed Care, Other (non HMO)

## 2016-11-09 DIAGNOSIS — C8338 Diffuse large B-cell lymphoma, lymph nodes of multiple sites: Secondary | ICD-10-CM

## 2016-11-09 LAB — CBC WITH DIFFERENTIAL/PLATELET
BASO%: 0.1 % (ref 0.0–2.0)
BASOS ABS: 0 10*3/uL (ref 0.0–0.1)
EOS ABS: 0 10*3/uL (ref 0.0–0.5)
EOS%: 0.1 % (ref 0.0–7.0)
HCT: 29 % — ABNORMAL LOW (ref 34.8–46.6)
HGB: 9.8 g/dL — ABNORMAL LOW (ref 11.6–15.9)
LYMPH%: 2.6 % — AB (ref 14.0–49.7)
MCH: 33 pg (ref 25.1–34.0)
MCHC: 33.8 g/dL (ref 31.5–36.0)
MCV: 97.8 fL (ref 79.5–101.0)
MONO#: 0.6 10*3/uL (ref 0.1–0.9)
MONO%: 2.5 % (ref 0.0–14.0)
NEUT#: 22.5 10*3/uL — ABNORMAL HIGH (ref 1.5–6.5)
NEUT%: 94.7 % — ABNORMAL HIGH (ref 38.4–76.8)
Platelets: 35 10*3/uL — ABNORMAL LOW (ref 145–400)
RBC: 2.96 10*6/uL — AB (ref 3.70–5.45)
RDW: 17.6 % — ABNORMAL HIGH (ref 11.2–14.5)
WBC: 23.8 10*3/uL — ABNORMAL HIGH (ref 3.9–10.3)
lymph#: 0.6 10*3/uL — ABNORMAL LOW (ref 0.9–3.3)

## 2016-11-09 LAB — TECHNOLOGIST REVIEW

## 2016-11-10 ENCOUNTER — Telehealth: Payer: Self-pay

## 2016-11-10 ENCOUNTER — Other Ambulatory Visit: Payer: Self-pay | Admitting: Hematology and Oncology

## 2016-11-10 DIAGNOSIS — C8338 Diffuse large B-cell lymphoma, lymph nodes of multiple sites: Secondary | ICD-10-CM

## 2016-11-10 NOTE — Telephone Encounter (Signed)
Spoke with patient via phone to confirm Thursday's lab appt. Per Dr. Alvy Bimler, will add CMP to lab orders.

## 2016-11-12 ENCOUNTER — Other Ambulatory Visit (HOSPITAL_BASED_OUTPATIENT_CLINIC_OR_DEPARTMENT_OTHER): Payer: Managed Care, Other (non HMO)

## 2016-11-12 ENCOUNTER — Telehealth: Payer: Self-pay | Admitting: *Deleted

## 2016-11-12 ENCOUNTER — Ambulatory Visit (HOSPITAL_BASED_OUTPATIENT_CLINIC_OR_DEPARTMENT_OTHER): Payer: Managed Care, Other (non HMO)

## 2016-11-12 ENCOUNTER — Other Ambulatory Visit: Payer: Self-pay | Admitting: Hematology and Oncology

## 2016-11-12 ENCOUNTER — Ambulatory Visit (HOSPITAL_COMMUNITY)
Admission: RE | Admit: 2016-11-12 | Discharge: 2016-11-12 | Disposition: A | Discharge status: Home / Self Care | Payer: Managed Care, Other (non HMO) | Source: Ambulatory Visit | Attending: Hematology and Oncology | Admitting: Hematology and Oncology

## 2016-11-12 DIAGNOSIS — C8338 Diffuse large B-cell lymphoma, lymph nodes of multiple sites: Secondary | ICD-10-CM | POA: Diagnosis not present

## 2016-11-12 LAB — COMPREHENSIVE METABOLIC PANEL
ALBUMIN: 4.1 g/dL (ref 3.5–5.0)
ALK PHOS: 112 U/L (ref 40–150)
ALT: 61 U/L — ABNORMAL HIGH (ref 0–55)
AST: 19 U/L (ref 5–34)
Anion Gap: 8 mEq/L (ref 3–11)
BILIRUBIN TOTAL: 0.57 mg/dL (ref 0.20–1.20)
BUN: 17.5 mg/dL (ref 7.0–26.0)
CO2: 25 mEq/L (ref 22–29)
Calcium: 9.4 mg/dL (ref 8.4–10.4)
Chloride: 106 mEq/L (ref 98–109)
Creatinine: 0.7 mg/dL (ref 0.6–1.1)
Glucose: 107 mg/dl (ref 70–140)
POTASSIUM: 4.3 meq/L (ref 3.5–5.1)
SODIUM: 139 meq/L (ref 136–145)
TOTAL PROTEIN: 6.3 g/dL — AB (ref 6.4–8.3)

## 2016-11-12 LAB — CBC WITH DIFFERENTIAL/PLATELET
BASO%: 0.1 % (ref 0.0–2.0)
Basophils Absolute: 0 10*3/uL (ref 0.0–0.1)
EOS%: 0.5 % (ref 0.0–7.0)
Eosinophils Absolute: 0.1 10*3/uL (ref 0.0–0.5)
HCT: 27.2 % — ABNORMAL LOW (ref 34.8–46.6)
HGB: 9.5 g/dL — ABNORMAL LOW (ref 11.6–15.9)
LYMPH%: 8.9 % — ABNORMAL LOW (ref 14.0–49.7)
MCH: 33.1 pg (ref 25.1–34.0)
MCHC: 34.9 g/dL (ref 31.5–36.0)
MCV: 94.8 fL (ref 79.5–101.0)
MONO#: 0.9 10*3/uL (ref 0.1–0.9)
MONO%: 9.1 % (ref 0.0–14.0)
NEUT%: 81.4 % — ABNORMAL HIGH (ref 38.4–76.8)
NEUTROS ABS: 7.7 10*3/uL — AB (ref 1.5–6.5)
Platelets: 12 10*3/uL — ABNORMAL LOW (ref 145–400)
RBC: 2.87 10*6/uL — AB (ref 3.70–5.45)
RDW: 15.8 % — ABNORMAL HIGH (ref 11.2–14.5)
WBC: 9.4 10*3/uL (ref 3.9–10.3)
lymph#: 0.8 10*3/uL — ABNORMAL LOW (ref 0.9–3.3)
nRBC: 0 % (ref 0–0)

## 2016-11-12 LAB — TECHNOLOGIST REVIEW

## 2016-11-12 MED ORDER — SODIUM CHLORIDE 0.9 % IV SOLN
250.0000 mL | Freq: Once | INTRAVENOUS | Status: AC
  Administered 2016-11-12: 250 mL via INTRAVENOUS

## 2016-11-12 MED ORDER — ACETAMINOPHEN 325 MG PO TABS
ORAL_TABLET | ORAL | Status: AC
  Filled 2016-11-12: qty 2

## 2016-11-12 MED ORDER — DIPHENHYDRAMINE HCL 25 MG PO CAPS
ORAL_CAPSULE | ORAL | Status: AC
  Filled 2016-11-12: qty 2

## 2016-11-12 MED ORDER — ACETAMINOPHEN 325 MG PO TABS
650.0000 mg | ORAL_TABLET | Freq: Once | ORAL | Status: AC
  Administered 2016-11-12: 650 mg via ORAL

## 2016-11-12 MED ORDER — DIPHENHYDRAMINE HCL 25 MG PO CAPS
25.0000 mg | ORAL_CAPSULE | Freq: Once | ORAL | Status: AC
  Administered 2016-11-12: 25 mg via ORAL

## 2016-11-12 NOTE — Patient Instructions (Signed)
Platelet Transfusion Introduction A platelet transfusion is a procedure in which you receive donated platelets through an IV tube. Platelets are tiny pieces of blood cells. When a blood vessel is damaged, platelets collect in the damaged area to help form a blood clot. This begins the healing process. If your platelet count gets too low, your blood may have trouble clotting. You may need a platelet transfusion if you have a condition that causes a low number of platelets (thrombocytopenia). A platelet transfusion may be used to stop or prevent bleeding. Tell a health care provider about:  Any allergies you have.  All medicines you are taking, including vitamins, herbs, eye drops, creams, and over-the-counter medicines.  Any problems you or family members have had with anesthetic medicines.  Any blood disorders you have.  Any surgeries you have had.  Any medical conditions you have.  Any reactions you have had during a previous transfusion. What are the risks? Generally, this is a safe procedure. However, problems may occur, including:  Fever with or without chills. The fever usually occurs within the first 4 hours of the transfusion and returns to normal within 48 hours.  Allergic reaction. The reaction is most commonly caused by antibodies your body creates against substances in the transfusion. Signs of an allergic reaction may include itching, hives, difficulty breathing, shock, or low blood pressure.  Sudden (acute) or delayed hemolytic reaction. This rare reaction can occur during the transfusion and up to 28 days after the transfusion. The reaction usually occurs when your body's defense system (immune system) attacks the new platelets. Signs of a hemolytic reaction may include fever, headache, difficulty breathing, low blood pressure, a rapid heartbeat, or pain in your back, abdomen, chest, or IV site.  Transfusion-related acute lung injury (TRALI). TRALI can occur within hours of  a transfusion, or several days later. This is a rare reaction that causes lung damage. The cause is not known.  Infection. Signs of this rare complication may include fever, chills, vomiting, a rapid heartbeat, or low blood pressure. What happens before the procedure?  You may have a blood test to determine your blood type. This is necessary to find out what kind ofplatelets best matches your platelets.  If you have had an allergic reaction to a transfusion in the past, you may be given medicine to help prevent a reaction. Take this medicine only as directed by your health care provider.  Your temperature, blood pressure, and pulse will be monitored before the transfusion. What happens during the procedure?  An IV will be started in your hand or arm.  The transfusion will be attached to your IV tubing. The bag of donated platelets will be attached to your IV tube andgiven into your vein.  Your temperature, blood pressure, and pulse will be monitored regularly during the transfusion. This monitoring is done to help detect early signs of a transfusion reaction.  If you have any signs or symptoms of a reaction, your transfusion will be stopped and you may be given medicine.  When your transfusion is complete, your IV will be removed.  Pressure may be applied to the IV site for a few minutes.  A bandage (dressing) will be applied. The procedure may vary among health care providers and hospitals. What happens after the procedure?  Your blood pressure, temperature, and pulse will be monitored regularly. This information is not intended to replace advice given to you by your health care provider. Make sure you discuss any questions you have  with your health care provider. Document Released: 08/02/2007 Document Revised: 03/12/2016 Document Reviewed: 08/15/2014  2017 Elsevier

## 2016-11-12 NOTE — Progress Notes (Signed)
Patient requested/preferred peripheral IV access versus port-a-cath access. PIV obtained in R forearm.   Wylene Simmer, BSN, RN 11/12/2016 3:59 PM

## 2016-11-12 NOTE — Telephone Encounter (Signed)
Pt aware of lab results and low platelet count today.  She is willing to stay for Platelet transfusion as ordered by Dr. Alvy Bimler.

## 2016-11-13 LAB — PREPARE PLATELET PHERESIS
Blood Product Expiration Date: 201801272359
ISSUE DATE / TIME: 201801251602
Unit Type and Rh: 600

## 2016-11-16 ENCOUNTER — Ambulatory Visit (HOSPITAL_BASED_OUTPATIENT_CLINIC_OR_DEPARTMENT_OTHER): Payer: Managed Care, Other (non HMO) | Admitting: Hematology and Oncology

## 2016-11-16 ENCOUNTER — Other Ambulatory Visit (HOSPITAL_BASED_OUTPATIENT_CLINIC_OR_DEPARTMENT_OTHER): Payer: Managed Care, Other (non HMO)

## 2016-11-16 ENCOUNTER — Telehealth: Payer: Self-pay | Admitting: *Deleted

## 2016-11-16 ENCOUNTER — Telehealth: Payer: Self-pay

## 2016-11-16 ENCOUNTER — Other Ambulatory Visit: Payer: Self-pay | Admitting: Hematology and Oncology

## 2016-11-16 DIAGNOSIS — C8338 Diffuse large B-cell lymphoma, lymph nodes of multiple sites: Secondary | ICD-10-CM | POA: Diagnosis not present

## 2016-11-16 DIAGNOSIS — D61818 Other pancytopenia: Secondary | ICD-10-CM | POA: Diagnosis not present

## 2016-11-16 LAB — CBC WITH DIFFERENTIAL/PLATELET
BASO%: 0.2 % (ref 0.0–2.0)
Basophils Absolute: 0 10*3/uL (ref 0.0–0.1)
EOS%: 0 % (ref 0.0–7.0)
Eosinophils Absolute: 0 10*3/uL (ref 0.0–0.5)
HCT: 27 % — ABNORMAL LOW (ref 34.8–46.6)
HGB: 9.2 g/dL — ABNORMAL LOW (ref 11.6–15.9)
LYMPH%: 8 % — AB (ref 14.0–49.7)
MCH: 33.1 pg (ref 25.1–34.0)
MCHC: 34.1 g/dL (ref 31.5–36.0)
MCV: 97.1 fL (ref 79.5–101.0)
MONO#: 0.4 10*3/uL (ref 0.1–0.9)
MONO%: 8.7 % (ref 0.0–14.0)
NEUT%: 83.1 % — AB (ref 38.4–76.8)
NEUTROS ABS: 3.6 10*3/uL (ref 1.5–6.5)
PLATELETS: 33 10*3/uL — AB (ref 145–400)
RBC: 2.78 10*6/uL — AB (ref 3.70–5.45)
RDW: 16.2 % — ABNORMAL HIGH (ref 11.2–14.5)
WBC: 4.3 10*3/uL (ref 3.9–10.3)
lymph#: 0.3 10*3/uL — ABNORMAL LOW (ref 0.9–3.3)

## 2016-11-16 LAB — COMPREHENSIVE METABOLIC PANEL
ALT: 35 U/L (ref 0–55)
AST: 23 U/L (ref 5–34)
Albumin: 4.1 g/dL (ref 3.5–5.0)
Alkaline Phosphatase: 92 U/L (ref 40–150)
Anion Gap: 9 mEq/L (ref 3–11)
BUN: 10.3 mg/dL (ref 7.0–26.0)
CHLORIDE: 106 meq/L (ref 98–109)
CO2: 27 meq/L (ref 22–29)
CREATININE: 0.7 mg/dL (ref 0.6–1.1)
Calcium: 9.6 mg/dL (ref 8.4–10.4)
GLUCOSE: 108 mg/dL (ref 70–140)
Potassium: 3.9 mEq/L (ref 3.5–5.1)
SODIUM: 142 meq/L (ref 136–145)
Total Bilirubin: 0.65 mg/dL (ref 0.20–1.20)
Total Protein: 6.4 g/dL (ref 6.4–8.3)

## 2016-11-16 LAB — TECHNOLOGIST REVIEW

## 2016-11-16 MED ORDER — DEXAMETHASONE 4 MG PO TABS: 40.0000 mg | ORAL_TABLET | Freq: Every day | ORAL | 0 refills | Status: AC

## 2016-11-16 NOTE — Telephone Encounter (Signed)
Pt notified of results below. Verbalized understanding.  

## 2016-11-16 NOTE — Telephone Encounter (Signed)
-----   Message from Heath Lark, MD sent at 11/16/2016  2:29 PM EST ----- Regarding: CMET results Pls let her know CMET is OK Remind her to take steroids with food ----- Message ----- From: Interface, Lab In Three Zero One Sent: 11/16/2016   1:51 PM To: Heath Lark, MD

## 2016-11-16 NOTE — Telephone Encounter (Signed)
Pt states she is having labs drawn at 1330 today. She is asking if someone can step out and look at the tumors on her neck. She says they are growing, they are not compromising her trachea yet but she would like someone to look at them.  She can be reached on her mobile phone.

## 2016-11-17 ENCOUNTER — Encounter: Payer: Self-pay | Admitting: Hematology and Oncology

## 2016-11-17 NOTE — Assessment & Plan Note (Signed)
This is likely due to recent treatment. The patient denies recent history of bleeding such as epistaxis, hematuria or hematochezia. She is asymptomatic from the pancytopenia.  She does not need transfusion today I will proceed to give her 2 unit of blood transfusion if hemoglobin is less than 8 and and 1 unit of platelets if platelet count is less than 20,000. She would need irradiated blood products

## 2016-11-17 NOTE — Assessment & Plan Note (Signed)
I spoke with her physician at [facility]. The patient has significant enlarged lymph node over the weekend. I saw the patient last week and I did not notice significant enlarged lymph node. This is likely due to rapidly relapsing disease. Per discussion with her physician at [facility], I will start her on high-dose dexamethasone at 40 mg daily for 4 days. She has appointment to return to [facility] on 11/18/2016 for further evaluation and possibly chemotherapy

## 2016-11-17 NOTE — Progress Notes (Signed)
[facility] OFFICE PROGRESS NOTE  Patient Care Team: Devra Dopp, MD as PCP - General (Family Medicine) Ermalinda Barrios, MD as Consulting Physician (Otolaryngology)  SUMMARY OF ONCOLOGIC HISTORY:   DLBCL (diffuse large B cell lymphoma) (HCC)   06/30/2016 Procedure    She had excisional lymph node biopsy in her neck      06/30/2016 Pathology Results    Accession: [id number] biopsy from neck showed DLBCB      07/03/2016 Bone Marrow Biopsy    She had bone marrow biopsy at [facility] which showed high-grade B-cell lymphoma, with MYC and BCL2 and BCL6 translocations extensively involving a hypercellular bone marrow (70-80%) with mild megakaryocytic hyperplasia and normal erythropoiesis and myelopoiesis.      07/06/2016 - 07/31/2016 Chemotherapy    The patient received several cycles of R-EPOCH at [facility]      07/27/2016 Procedure    She had successful placement of a port-catheter with tip terminating in the right atrium      08/03/2016 Imaging    Venous Doppler ultrasound of the right lower extremity excluded DVT      08/13/2016 PET scan    She underwent PET CT scan and at [facility] which showed Hypermetabolic left level 1b lymph node measuring 1.8 x 1.5 cm in axial dimension (image 32) with a maximal SUV of 8.4. Hypermetabolic left level 5B lymph node measuring 0.9 x 0.6 cm in axial dimension (image 51) with a maximal SUV of 5.8. She has no other lymphadenopathy elsewhere      08/28/2016 - 08/31/2016 Hospital Admission    The patient received chemotherapy at [facility] for cycle 1 of CODOX-M          09/22/2016 - 09/27/2016 Hospital Admission    She was admitted to [facility] with intrathecal methotrexate on 09/22/16.          10/04/2016 - 10/07/2016 Hospital Admission    She was admitted to [facility] for management of neutropenic fever      10/16/2016 PET scan    Interval enlargement and increased metabolic activity in the left  submandibular node with an SUV max of 10.08. Additionally there is increased metabolic activity throughout multiple nodes in the left neck and supraclavicular region compared to the prior PET/CT. Findings consistent with De[place] 5 and lack of response to chemotherapy.      11/03/2016 Procedure    Cell procurement for CD19 CAR-T        11/03/2016 -  Chemotherapy    The patient had palliative chemo with Gem Ox       INTERVAL HISTORY: Please see below for problem oriented charting. She is seen urgently per patient request. Last week, I spoke with her briefly without formal evaluation regarding pancytopenia as a result from chemotherapy. She received platelet transfusion last week. Over the weekend, she noted rapidly enlarging left sided cervical lymph node. It is not causing any form of airway compromise or swallowing difficulties. Apart from mild discomfort, she denies pain  REVIEW OF SYSTEMS:   Constitutional: Denies fevers, chills or abnormal weight loss Eyes: Denies blurriness of vision Ears, nose, mouth, throat, and face: Denies mucositis or sore throat Respiratory: Denies cough, dyspnea or wheezes Cardiovascular: Denies palpitation, chest discomfort or lower extremity swelling Gastrointestinal:  Denies nausea, heartburn or change in bowel habits Skin: Denies abnormal skin rashes Neurological:Denies numbness, tingling or new weaknesses Behavioral/Psych: Mood is stable, no new changes  All other systems were reviewed with the patient and  are negative.  I have reviewed the past medical history, past surgical history, social history and family history with the patient and they are unchanged from previous note.  ALLERGIES:  is allergic to codeine and tramadol.  MEDICATIONS:  Current Outpatient Prescriptions  Medication Sig Dispense Refill  . acetaminophen (TYLENOL) 325 MG tablet Take 650 mg by mouth every 6 (six) hours as needed for moderate pain or headache.     Marland Kitchen acyclovir  (ZOVIRAX) 400 MG tablet Take 1 tablet by mouth twice daily while neutropenic. Your physician will instruct you when to stop taking.    Marland Kitchen dexamethasone (DECADRON) 4 MG tablet Take 10 tablets (40 mg total) by mouth daily. 40 tablet 0  . docusate sodium (COLACE) 100 MG capsule Take 100 mg by mouth daily as needed for mild constipation.    . fluconazole (DIFLUCAN) 200 MG tablet Take 1 tablet by mouth once daily while neutropenic. Your physician will instruct you when to stop taking.    Marland Kitchen levofloxacin (LEVAQUIN) 500 MG tablet Take 1 tablet by mouth once daily while neutropenic. Your physician will instruct you when to stop taking.    . lidocaine-prilocaine (EMLA) cream APPLY A SMALL AMOUNT OVER PAC 30-45 MINUTES PRIOR TO ACCESS. DO NOT USE IF DERMABOND STILL PRESENT.  2  . loratadine (CLARITIN) 10 MG tablet Take 10 mg by mouth daily as needed for allergies.    Marland Kitchen LORazepam (ATIVAN) 0.5 MG tablet TAKE 1 TABLET BY MOUTH EVERY 8 HOURS 30 tablet 0  . ondansetron (ZOFRAN) 8 MG tablet Take 8 mg by mouth.    . promethazine (PHENERGAN) 25 MG tablet Take 1 tablet (25 mg total) by mouth every 6 (six) hours as needed for nausea or vomiting. 60 tablet 1  . senna (SENOKOT) 8.6 MG TABS tablet Take 1 tablet by mouth daily as needed for mild constipation.    . sulfamethoxazole-trimethoprim (BACTRIM DS,SEPTRA DS) 800-160 MG tablet Take 1 tablet by mouth 3 (three) times a week.     No current facility-administered medications for this visit.     PHYSICAL EXAMINATION: ECOG PERFORMANCE STATUS: 1 - Symptomatic but completely ambulatory GENERAL:alert, no distress and comfortable SKIN: skin color, texture, turgor are normal, no rashes or significant lesions EYES: normal, Conjunctiva are pink and non-injected, sclera clear OROPHARYNX:no exudate, no erythema and lips, buccal mucosa, and tongue normal  NECK: supple, thyroid normal size, non-tender, without nodularity LYMPH: Noted significant lymphadenopathy on the left side  of her neck  LUNGS: clear to auscultation and percussion with normal breathing effort HEART: regular rate & rhythm and no murmurs and no lower extremity edema ABDOMEN:abdomen soft, non-tender and normal bowel sounds Musculoskeletal:no cyanosis of digits and no clubbing  NEURO: alert & oriented x 3 with fluent speech, no focal motor/sensory deficits  LABORATORY DATA:  I have reviewed the data as listed    Component Value Date/Time   NA 142 11/16/2016 1345   K 3.9 11/16/2016 1345   CL 108 06/22/2016 0938   CO2 27 11/16/2016 1345   GLUCOSE 108 11/16/2016 1345   BUN 10.3 11/16/2016 1345   CREATININE 0.7 11/16/2016 1345   CALCIUM 9.6 11/16/2016 1345   PROT 6.4 11/16/2016 1345   ALBUMIN 4.1 11/16/2016 1345   AST 23 11/16/2016 1345   ALT 35 11/16/2016 1345   ALKPHOS 92 11/16/2016 1345   BILITOT 0.65 11/16/2016 1345   GFRNONAA >60 06/22/2016 0938   GFRAA >60 06/22/2016 0938    No results found for: SPEP, UPEP  Lab  Results  Component Value Date   WBC 4.3 11/16/2016   NEUTROABS 3.6 11/16/2016   HGB 9.2 (L) 11/16/2016   HCT 27.0 (L) 11/16/2016   MCV 97.1 11/16/2016   PLT 33 (L) 11/16/2016      Chemistry      Component Value Date/Time   NA 142 11/16/2016 1345   K 3.9 11/16/2016 1345   CL 108 06/22/2016 0938   CO2 27 11/16/2016 1345   BUN 10.3 11/16/2016 1345   CREATININE 0.7 11/16/2016 1345      Component Value Date/Time   CALCIUM 9.6 11/16/2016 1345   ALKPHOS 92 11/16/2016 1345   AST 23 11/16/2016 1345   ALT 35 11/16/2016 1345   BILITOT 0.65 11/16/2016 1345      ASSESSMENT & PLAN:  DLBCL (diffuse large B cell lymphoma) ([place]) I spoke with her physician at [facility]. The patient has significant enlarged lymph node over the weekend. I saw the patient last week and I did not notice significant enlarged lymph node. This is likely due to rapidly relapsing disease. Per discussion with her physician at [facility], I will start her on high-dose  dexamethasone at 40 mg daily for 4 days. She has appointment to return to [facility] on 11/18/2016 for further evaluation and possibly chemotherapy  Pancytopenia, acquired [facility]) This is likely due to recent treatment. The patient denies recent history of bleeding such as epistaxis, hematuria or hematochezia. She is asymptomatic from the pancytopenia.  She does not need transfusion today I will proceed to give her 2 unit of blood transfusion if hemoglobin is less than 8 and and 1 unit of platelets if platelet count is less than 20,000. She would need irradiated blood products   No orders of the defined types were placed in this encounter.  All questions were answered. The patient knows to call the clinic with any problems, questions or concerns. No barriers to learning was detected. I spent 15 minutes counseling the patient face to face. The total time spent in the appointment was 20 minutes and more than 50% was on counseling and review of test results     Heath Lark, MD 11/17/2016 9:19 AM

## 2016-11-18 ENCOUNTER — Telehealth: Payer: Self-pay | Admitting: Hematology and Oncology

## 2016-11-18 ENCOUNTER — Telehealth: Payer: Self-pay | Admitting: *Deleted

## 2016-11-18 NOTE — Telephone Encounter (Signed)
lvm to inform pt of 2/2 appt date/time per LOS

## 2016-11-18 NOTE — Telephone Encounter (Signed)
Faxed received from [facility] requesting lab and possible platelet transfusions on 2/2, 2/7 and 2/12.  Contact Megh Loscheider @ [phone]   Message sent to scheduler

## 2016-11-19 ENCOUNTER — Ambulatory Visit (HOSPITAL_COMMUNITY)
Admission: RE | Admit: 2016-11-19 | Discharge: 2016-11-19 | Disposition: A | Discharge status: Home / Self Care | Payer: Managed Care, Other (non HMO) | Source: Ambulatory Visit | Attending: Hematology and Oncology | Admitting: Hematology and Oncology

## 2016-11-19 ENCOUNTER — Other Ambulatory Visit: Payer: Self-pay | Admitting: Hematology and Oncology

## 2016-11-19 DIAGNOSIS — C8338 Diffuse large B-cell lymphoma, lymph nodes of multiple sites: Secondary | ICD-10-CM | POA: Insufficient documentation

## 2016-11-20 ENCOUNTER — Ambulatory Visit: Payer: Managed Care, Other (non HMO)

## 2016-11-20 ENCOUNTER — Telehealth: Payer: Self-pay

## 2016-11-20 ENCOUNTER — Other Ambulatory Visit (HOSPITAL_BASED_OUTPATIENT_CLINIC_OR_DEPARTMENT_OTHER): Payer: Managed Care, Other (non HMO)

## 2016-11-20 DIAGNOSIS — C8338 Diffuse large B-cell lymphoma, lymph nodes of multiple sites: Secondary | ICD-10-CM | POA: Diagnosis not present

## 2016-11-20 LAB — CBC WITH DIFFERENTIAL/PLATELET
BASO%: 0 % (ref 0.0–2.0)
BASOS ABS: 0 10*3/uL (ref 0.0–0.1)
EOS ABS: 0 10*3/uL (ref 0.0–0.5)
EOS%: 0 % (ref 0.0–7.0)
HCT: 27.4 % — ABNORMAL LOW (ref 34.8–46.6)
HGB: 9.8 g/dL — ABNORMAL LOW (ref 11.6–15.9)
LYMPH%: 2 % — AB (ref 14.0–49.7)
MCH: 34 pg (ref 25.1–34.0)
MCHC: 35.8 g/dL (ref 31.5–36.0)
MCV: 95.1 fL (ref 79.5–101.0)
MONO#: 0.4 10*3/uL (ref 0.1–0.9)
MONO%: 1.9 % (ref 0.0–14.0)
NEUT#: 18.9 10*3/uL — ABNORMAL HIGH (ref 1.5–6.5)
NEUT%: 96.1 % — ABNORMAL HIGH (ref 38.4–76.8)
Platelets: 40 10*3/uL — ABNORMAL LOW (ref 145–400)
RBC: 2.88 10*6/uL — ABNORMAL LOW (ref 3.70–5.45)
RDW: 16.8 % — AB (ref 11.2–14.5)
WBC: 19.6 10*3/uL — ABNORMAL HIGH (ref 3.9–10.3)
lymph#: 0.4 10*3/uL — ABNORMAL LOW (ref 0.9–3.3)

## 2016-11-20 LAB — COMPREHENSIVE METABOLIC PANEL
ALBUMIN: 4.1 g/dL (ref 3.5–5.0)
ALK PHOS: 89 U/L (ref 40–150)
ALT: 32 U/L (ref 0–55)
ANION GAP: 10 meq/L (ref 3–11)
AST: 18 U/L (ref 5–34)
BUN: 20.3 mg/dL (ref 7.0–26.0)
CALCIUM: 9.7 mg/dL (ref 8.4–10.4)
CO2: 26 mEq/L (ref 22–29)
Chloride: 103 mEq/L (ref 98–109)
Creatinine: 0.7 mg/dL (ref 0.6–1.1)
Glucose: 149 mg/dl — ABNORMAL HIGH (ref 70–140)
POTASSIUM: 3.3 meq/L — AB (ref 3.5–5.1)
SODIUM: 139 meq/L (ref 136–145)
Total Bilirubin: 1 mg/dL (ref 0.20–1.20)
Total Protein: 6.4 g/dL (ref 6.4–8.3)

## 2016-11-20 NOTE — Progress Notes (Signed)
Per Cameo, RN patient does not meet criteria for needing a transfusion today. Cameo met with her briefly, gave her lab printout and patient was sent home without any complaints.

## 2016-11-20 NOTE — Telephone Encounter (Signed)
Called patient and instructed to follow a potassium rich diet. Potassium was 3.3 today. Verbalized understanding.

## 2016-11-25 ENCOUNTER — Ambulatory Visit (HOSPITAL_BASED_OUTPATIENT_CLINIC_OR_DEPARTMENT_OTHER): Payer: Managed Care, Other (non HMO)

## 2016-11-25 ENCOUNTER — Other Ambulatory Visit (HOSPITAL_BASED_OUTPATIENT_CLINIC_OR_DEPARTMENT_OTHER): Payer: Managed Care, Other (non HMO)

## 2016-11-25 ENCOUNTER — Other Ambulatory Visit: Payer: Self-pay | Admitting: Hematology and Oncology

## 2016-11-25 ENCOUNTER — Ambulatory Visit (HOSPITAL_BASED_OUTPATIENT_CLINIC_OR_DEPARTMENT_OTHER): Payer: Managed Care, Other (non HMO) | Admitting: Hematology and Oncology

## 2016-11-25 DIAGNOSIS — C8338 Diffuse large B-cell lymphoma, lymph nodes of multiple sites: Secondary | ICD-10-CM | POA: Diagnosis not present

## 2016-11-25 DIAGNOSIS — I82421 Acute embolism and thrombosis of right iliac vein: Secondary | ICD-10-CM | POA: Diagnosis not present

## 2016-11-25 DIAGNOSIS — D61818 Other pancytopenia: Secondary | ICD-10-CM

## 2016-11-25 LAB — COMPREHENSIVE METABOLIC PANEL
ALT: 38 U/L (ref 0–55)
AST: 21 U/L (ref 5–34)
Albumin: 4 g/dL (ref 3.5–5.0)
Alkaline Phosphatase: 105 U/L (ref 40–150)
Anion Gap: 8 mEq/L (ref 3–11)
BUN: 19.1 mg/dL (ref 7.0–26.0)
CALCIUM: 9.3 mg/dL (ref 8.4–10.4)
CHLORIDE: 107 meq/L (ref 98–109)
CO2: 24 mEq/L (ref 22–29)
Creatinine: 0.7 mg/dL (ref 0.6–1.1)
EGFR: >90 mL/min/{1.73_m2} (ref >90)
Glucose: 96 mg/dl (ref 70–140)
POTASSIUM: 3.9 meq/L (ref 3.5–5.1)
SODIUM: 139 meq/L (ref 136–145)
Total Bilirubin: 0.65 mg/dL (ref 0.20–1.20)
Total Protein: 6 g/dL — ABNORMAL LOW (ref 6.4–8.3)

## 2016-11-25 LAB — CBC WITH DIFFERENTIAL/PLATELET
BASO%: 0 % (ref 0.0–2.0)
Basophils Absolute: 0 10*3/uL (ref 0.0–0.1)
EOS ABS: 0 10*3/uL (ref 0.0–0.5)
EOS%: 0 % (ref 0.0–7.0)
HEMATOCRIT: 25.5 % — AB (ref 34.8–46.6)
HGB: 8.6 g/dL — ABNORMAL LOW (ref 11.6–15.9)
LYMPH#: 0.2 10*3/uL — AB (ref 0.9–3.3)
LYMPH%: 4.9 % — AB (ref 14.0–49.7)
MCH: 33.7 pg (ref 25.1–34.0)
MCHC: 33.7 g/dL (ref 31.5–36.0)
MCV: 100 fL (ref 79.5–101.0)
MONO#: 0.4 10*3/uL (ref 0.1–0.9)
MONO%: 7.4 % (ref 0.0–14.0)
NEUT%: 87.7 % — AB (ref 38.4–76.8)
NEUTROS ABS: 4.2 10*3/uL (ref 1.5–6.5)
PLATELETS: 20 10*3/uL — AB (ref 145–400)
RBC: 2.55 10*6/uL — AB (ref 3.70–5.45)
RDW: 16 % — ABNORMAL HIGH (ref 11.2–14.5)
WBC: 4.7 10*3/uL (ref 3.9–10.3)

## 2016-11-25 LAB — TECHNOLOGIST REVIEW

## 2016-11-25 MED ORDER — ACETAMINOPHEN 325 MG PO TABS
ORAL_TABLET | ORAL | Status: AC
  Filled 2016-11-25: qty 2

## 2016-11-25 MED ORDER — DIPHENHYDRAMINE HCL 25 MG PO CAPS
25.0000 mg | ORAL_CAPSULE | Freq: Once | ORAL | Status: AC
  Administered 2016-11-25: 25 mg via ORAL

## 2016-11-25 MED ORDER — DIPHENHYDRAMINE HCL 25 MG PO CAPS
ORAL_CAPSULE | ORAL | Status: AC
  Filled 2016-11-25: qty 1

## 2016-11-25 MED ORDER — SODIUM CHLORIDE 0.9 % IV SOLN
250.0000 mL | Freq: Once | INTRAVENOUS | Status: AC
  Administered 2016-11-25: 250 mL via INTRAVENOUS

## 2016-11-25 MED ORDER — ACETAMINOPHEN 325 MG PO TABS
650.0000 mg | ORAL_TABLET | Freq: Once | ORAL | Status: AC
  Administered 2016-11-25: 650 mg via ORAL

## 2016-11-25 MED ORDER — LORAZEPAM 1 MG PO TABS: 1.0000 mg | ORAL_TABLET | Freq: Three times a day (TID) | ORAL | 0 refills | Status: AC | PRN

## 2016-11-25 NOTE — Patient Instructions (Signed)
Platelet Transfusion, Care After Introduction Refer to this sheet in the next few weeks. These instructions provide you with information about caring for yourself after your procedure. Your health care provider may also give you more specific instructions. Your treatment has been planned according to current medical practices, but problems sometimes occur. Call your health care provider if you have any problems or questions after your procedure. What can I expect after the procedure? After the procedure, it is common to have:  Bruising and soreness at the IV site.  Fever or chills within the first 48 hours of your transfusion. Follow these instructions at home:  Take medicines only as directed by your health care provider. Ask your health care provider if you can take an over-the-counter pain reliever in case you have a fever or headache a day or two after your transfusion.  Return to your normal activities as directed by your health care provider. Contact a health care provider if:  You have a fever.  You have a headache.  You have redness, swelling, or pain at your IV site.  You have skin itching or a rash.  You vomit.  You feel unusually tired or weak. Get help right away if:  You have trouble breathing.  You have a decreased amount of urine or you urinate less often than you normally do.  Your urine is darker than normal.  You have pain in your back, abdomen, or chest.  You have cool, clammy skin.  You have a rapid heartbeat. This information is not intended to replace advice given to you by your health care provider. Make sure you discuss any questions you have with your health care provider. Document Released: 10/26/2014 Document Revised: 03/12/2016 Document Reviewed: 08/15/2014  2017 Elsevier  

## 2016-11-26 ENCOUNTER — Telehealth: Payer: Self-pay | Admitting: *Deleted

## 2016-11-26 DIAGNOSIS — I82421 Acute embolism and thrombosis of right iliac vein: Secondary | ICD-10-CM | POA: Insufficient documentation

## 2016-11-26 LAB — PREPARE PLATELET PHERESIS
BLOOD PRODUCT EXPIRATION DATE: 201802072359
ISSUE DATE / TIME: 201802071047
UNIT TYPE AND RH: 7300

## 2016-11-26 NOTE — Assessment & Plan Note (Signed)
She is currently on chronic anticoagulation therapy. We will attempt to keep her platelet count greater than 30,000

## 2016-11-26 NOTE — Progress Notes (Signed)
[facility] OFFICE PROGRESS NOTE  Patient Care Team: Helane Rima, MD as PCP - General (Family Medicine) Vicie Mutters, MD as Consulting Physician (Otolaryngology)  SUMMARY OF ONCOLOGIC HISTORY:   DLBCL (diffuse large B cell lymphoma) ([place])   06/30/2016 Procedure    She had excisional lymph node biopsy in her neck      06/30/2016 Pathology Results    Accession: [id number] biopsy from neck showed DLBCB      07/03/2016 Bone Marrow Biopsy    She had bone marrow biopsy at [facility] which showed high-grade B-cell lymphoma, with MYC and BCL2 and BCL6 translocations extensively involving a hypercellular bone marrow (70-80%) with mild megakaryocytic hyperplasia and normal erythropoiesis and myelopoiesis.      07/06/2016 - 07/31/2016 Chemotherapy    The patient received several cycles of R-EPOCH at [facility]      07/27/2016 Procedure    She had successful placement of a port-catheter with tip terminating in the right atrium      08/03/2016 Imaging    Venous Doppler ultrasound of the right lower extremity excluded DVT      08/13/2016 PET scan    She underwent PET CT scan and at [facility] which showed Hypermetabolic left level 1b lymph node measuring 1.8 x 1.5 cm in axial dimension (image 32) with a maximal SUV of 8.4. Hypermetabolic left level 5B lymph node measuring 0.9 x 0.6 cm in axial dimension (image 51) with a maximal SUV of 5.8. She has no other lymphadenopathy elsewhere      08/28/2016 - 08/31/2016 Hospital Admission    The patient received chemotherapy at [facility] for cycle 1 of CODOX-M          09/22/2016 - 09/27/2016 Hospital Admission    She was admitted to [facility] with intrathecal methotrexate on 09/22/16.          10/04/2016 - 10/07/2016 Hospital Admission    She was admitted to [facility] for management of neutropenic fever      10/16/2016 PET scan    Interval enlargement and increased metabolic activity in the left  submandibular node with an SUV max of 10.08. Additionally there is increased metabolic activity throughout multiple nodes in the left neck and supraclavicular region compared to the prior PET/CT. Findings consistent with [place] 5 and lack of response to chemotherapy.      11/03/2016 Procedure    Cell procurement for CD19 CAR-T        11/03/2016 -  Chemotherapy    The patient had palliative chemo with Gem Ox      11/03/2016 Procedure    She underwent procurement for CAR manipulation       INTERVAL HISTORY: Please see below for problem oriented charting. I review her outside records and collaborated the history with the patient. She just completed lymph node biopsy. She returns today for transfusion support. The lymph node on the left side of her neck is stable, slightly reduced in size. She denies side effects from treatment such as nausea, mucositis or vomiting. She denies recent issue from hemorrhoidal bleeding The patient denies any recent signs or symptoms of bleeding such as spontaneous epistaxis, hematuria or hematochezia.  REVIEW OF SYSTEMS:   Constitutional: Denies fevers, chills or abnormal weight loss Eyes: Denies blurriness of vision Ears, nose, mouth, throat, and face: Denies mucositis or sore throat Respiratory: Denies cough, dyspnea or wheezes Cardiovascular: Denies palpitation, chest discomfort or lower extremity swelling Gastrointestinal:  Denies nausea, heartburn or change  in bowel habits Skin: Denies abnormal skin rashes Lymphatics: Denies new lymphadenopathy or easy bruising Neurological:Denies numbness, tingling or new weaknesses Behavioral/Psych: Mood is stable, no new changes  All other systems were reviewed with the patient and are negative.  I have reviewed the past medical history, past surgical history, social history and family history with the patient and they are unchanged from previous note.  ALLERGIES:  is allergic to codeine and  tramadol.  MEDICATIONS:  Current Outpatient Prescriptions  Medication Sig Dispense Refill  . acetaminophen (TYLENOL) 325 MG tablet Take 650 mg by mouth every 6 (six) hours as needed for moderate pain or headache.     . allopurinol (ZYLOPRIM) 300 MG tablet Take 300 mg by mouth daily.    Marland Kitchen docusate sodium (COLACE) 100 MG capsule Take 100 mg by mouth daily as needed for mild constipation.    . enoxaparin (LOVENOX) 60 MG/0.6ML injection Inject 60 mg into the skin 2 (two) times daily.    Marland Kitchen HYDROmorphone (DILAUDID) 2 MG tablet Take 2 mg by mouth every 4 (four) hours as needed.    Marland Kitchen LORazepam (ATIVAN) 1 MG tablet Take 1 tablet (1 mg total) by mouth every 8 (eight) hours as needed for anxiety. 60 tablet 0  . ondansetron (ZOFRAN) 8 MG tablet Take 8 mg by mouth every 8 (eight) hours as needed.    . predniSONE (DELTASONE) 50 MG tablet Take 100 mg by mouth daily.    Marland Kitchen acyclovir (ZOVIRAX) 400 MG tablet Take 1 tablet by mouth twice daily while neutropenic. Your physician will instruct you when to stop taking.    Marland Kitchen dexamethasone (DECADRON) 4 MG tablet Take 10 tablets (40 mg total) by mouth daily. (Patient not taking: Reported on 11/25/2016) 40 tablet 0  . fluconazole (DIFLUCAN) 200 MG tablet Take 1 tablet by mouth once daily while neutropenic. Your physician will instruct you when to stop taking.    Marland Kitchen levofloxacin (LEVAQUIN) 500 MG tablet Take 1 tablet by mouth once daily while neutropenic. Your physician will instruct you when to stop taking.    . lidocaine-prilocaine (EMLA) cream APPLY A SMALL AMOUNT OVER PAC 30-45 MINUTES PRIOR TO ACCESS. DO NOT USE IF DERMABOND STILL PRESENT.  2  . loratadine (CLARITIN) 10 MG tablet Take 10 mg by mouth daily as needed for allergies.    Marland Kitchen ondansetron (ZOFRAN) 8 MG tablet Take 8 mg by mouth.    . promethazine (PHENERGAN) 25 MG tablet Take 1 tablet (25 mg total) by mouth every 6 (six) hours as needed for nausea or vomiting. (Patient not taking: Reported on 11/25/2016) 60 tablet 1   . senna (SENOKOT) 8.6 MG TABS tablet Take 1 tablet by mouth daily as needed for mild constipation.    . sulfamethoxazole-trimethoprim (BACTRIM DS,SEPTRA DS) 800-160 MG tablet Take 1 tablet by mouth 3 (three) times a week.     No current facility-administered medications for this visit.     PHYSICAL EXAMINATION: ECOG PERFORMANCE STATUS: 1 - Symptomatic but completely ambulatory  Vitals:   11/25/16 0920  BP: 108/65  Pulse: 89  Resp: 18  Temp: 98.6 F (37 C)   Filed Weights   11/25/16 0920  Weight: 136 lb 6.4 oz (61.9 kg)    GENERAL:alert, no distress and comfortable SKIN: skin color, texture, turgor are normal, no rashes or significant lesions EYES: normal, Conjunctiva are pink and non-injected, sclera clear OROPHARYNX:no exudate, no erythema and lips, buccal mucosa, and tongue normal  NECK: supple, thyroid normal size, non-tender, without nodularity  LYMPH: She has significant palpable lymphadenopathy on the left side of the neck LUNGS: clear to auscultation and percussion with normal breathing effort HEART: regular rate & rhythm and no murmurs and no lower extremity edema ABDOMEN:abdomen soft, non-tender and normal bowel sounds Musculoskeletal:no cyanosis of digits and no clubbing  NEURO: alert & oriented x 3 with fluent speech, no focal motor/sensory deficits  LABORATORY DATA:  I have reviewed the data as listed    Component Value Date/Time   NA 139 11/25/2016 0907   K 3.9 11/25/2016 0907   CL 108 06/22/2016 0938   CO2 24 11/25/2016 0907   GLUCOSE 96 11/25/2016 0907   BUN 19.1 11/25/2016 0907   CREATININE 0.7 11/25/2016 0907   CALCIUM 9.3 11/25/2016 0907   PROT 6.0 (L) 11/25/2016 0907   ALBUMIN 4.0 11/25/2016 0907   AST 21 11/25/2016 0907   ALT 38 11/25/2016 0907   ALKPHOS 105 11/25/2016 0907   BILITOT 0.65 11/25/2016 0907   GFRNONAA >60 06/22/2016 0938   GFRAA >60 06/22/2016 0938    No results found for: SPEP, UPEP  Lab Results  Component Value Date    WBC 4.7 11/25/2016   NEUTROABS 4.2 11/25/2016   HGB 8.6 (L) 11/25/2016   HCT 25.5 (L) 11/25/2016   MCV 100.0 11/25/2016   PLT 20 (L) 11/25/2016      Chemistry      Component Value Date/Time   NA 139 11/25/2016 0907   K 3.9 11/25/2016 0907   CL 108 06/22/2016 0938   CO2 24 11/25/2016 0907   BUN 19.1 11/25/2016 0907   CREATININE 0.7 11/25/2016 0907      Component Value Date/Time   CALCIUM 9.3 11/25/2016 0907   ALKPHOS 105 11/25/2016 0907   AST 21 11/25/2016 0907   ALT 38 11/25/2016 0907   BILITOT 0.65 11/25/2016 0907     ASSESSMENT & PLAN:  DLBCL (diffuse large B cell lymphoma) (HCC) I reviewed the records extensively. We will continue supportive care with close blood count monitoring. She had lymph node biopsy done on 11/23/2016. She will continue high-dose steroids daily She will return on 11/27/2016 for PET CT scan and admission. She will continue antimicrobial therapy as directed.  Pancytopenia, acquired [facility]) This is likely due to recent treatment. The patient denies recent history of bleeding such as epistaxis, hematuria or hematochezia. She is asymptomatic from the pancytopenia.  Due to her need for anticoagulation therapy, I will proceed to give her platelets today We discussed some of the risks, benefits, and alternatives of platelets transfusions. The patient is symptomatic from low platelet counts with bruising/bleeding/at high risk of life-threatening bleeding and the platelet count is critically low.  Some of the side-effects to be expected including risks of transfusion reactions, chills, infection, syndrome of volume overload and risk of hospitalization from various reasons and the patient is willing to proceed and went ahead to sign consent today.  I will proceed to give her 2 unit of blood transfusion if hemoglobin is less than 8 and and 1 unit of platelets if platelet count is less than 30,000. 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 We will attempt to keep her platelet count greater than 30,000   Orders Placed This Encounter  Procedures  . Prepare Pheresed Platelets    Standing Status:   Standing    Number of Occurrences:   1  Order Specific Question:   Number of Apheresis Units    Answer:   1 unit (6-10 packs)    Order Specific Question:   Transfusion Indications    Answer:   Actively Bleeding    Order Specific Question:   Special Requirements    Answer:   Irradiated   All questions were answered. The patient knows to call the clinic with any problems, questions or concerns. No barriers to learning was detected. I spent 25 minutes counseling the patient face to face. The total time spent in the appointment was 30 minutes and more than 50% was on counseling and review of test results     Heath Lark, MD 11/26/2016 8:47 AM

## 2016-11-26 NOTE — Assessment & Plan Note (Signed)
This is likely due to recent treatment. The patient denies recent history of bleeding such as epistaxis, hematuria or hematochezia. She is asymptomatic from the pancytopenia.  Due to her need for anticoagulation therapy, I will proceed to give her platelets today We discussed some of the risks, benefits, and alternatives of platelets transfusions. The patient is symptomatic from low platelet counts with bruising/bleeding/at high risk of life-threatening bleeding and the platelet count is critically low.  Some of the side-effects to be expected including risks of transfusion reactions, chills, infection, syndrome of volume overload and risk of hospitalization from various reasons and the patient is willing to proceed and went ahead to sign consent today.  I will proceed to give her 2 unit of blood transfusion if hemoglobin is less than 8 and and 1 unit of platelets if platelet count is less than 30,000. She would need irradiated blood products

## 2016-11-26 NOTE — Telephone Encounter (Signed)
FYI "I just received a call from [facility] in reference to my Clinical Trial in [place].  I think they're going to admit me on Sunday so I won't need the appointments on Monday."  Asked that she call to confirm leaving detailed message if receives voicemail when plan is definitive.  Appointments remain as scheduled at this time.

## 2016-11-26 NOTE — Assessment & Plan Note (Addendum)
I reviewed the records extensively. We will continue supportive care with close blood count monitoring. She had lymph node biopsy done on 11/23/2016. She will continue high-dose steroids daily She will return on 11/27/2016 for PET CT scan and admission. She will continue antimicrobial therapy as directed.

## 2016-11-30 ENCOUNTER — Other Ambulatory Visit: Payer: Managed Care, Other (non HMO)

## 2016-12-04 ENCOUNTER — Ambulatory Visit: Payer: Managed Care, Other (non HMO) | Admitting: Hematology and Oncology

## 2016-12-30 ENCOUNTER — Telehealth: Payer: Self-pay | Admitting: *Deleted

## 2016-12-30 ENCOUNTER — Other Ambulatory Visit: Payer: Self-pay | Admitting: *Deleted

## 2016-12-30 ENCOUNTER — Telehealth: Payer: Self-pay | Admitting: Hematology and Oncology

## 2016-12-30 DIAGNOSIS — C9110 Chronic lymphocytic leukemia of B-cell type not having achieved remission: Secondary | ICD-10-CM

## 2016-12-30 NOTE — Telephone Encounter (Signed)
Lattie Haw from [facility] states Hgb is 7.9, will need 2 units of blood at [facility]. Dr Alvy Bimler is OK with transfusing here.  Pt will come at 0900 to [facility] for labs, 1000 to [facility] for transfusion

## 2016-12-30 NOTE — Telephone Encounter (Signed)
Received call from Lattie Haw at [facility] re patient receiving blood at [facility] tomorrow. Lattie Haw transferred to IT trainer. Note also taken to desk nurse.

## 2016-12-31 ENCOUNTER — Other Ambulatory Visit: Payer: Self-pay | Admitting: *Deleted

## 2016-12-31 ENCOUNTER — Ambulatory Visit (HOSPITAL_COMMUNITY)
Admission: RE | Admit: 2016-12-31 | Discharge: 2016-12-31 | Disposition: A | Discharge status: Home / Self Care | Payer: 59 | Source: Ambulatory Visit | Attending: Hematology and Oncology | Admitting: Hematology and Oncology

## 2016-12-31 ENCOUNTER — Ambulatory Visit (HOSPITAL_COMMUNITY)
Admission: RE | Admit: 2016-12-31 | Discharge: 2016-12-31 | Disposition: A | Discharge status: Home / Self Care | Payer: 59 | Source: Ambulatory Visit

## 2016-12-31 ENCOUNTER — Ambulatory Visit (HOSPITAL_BASED_OUTPATIENT_CLINIC_OR_DEPARTMENT_OTHER): Payer: 59

## 2016-12-31 DIAGNOSIS — C8338 Diffuse large B-cell lymphoma, lymph nodes of multiple sites: Secondary | ICD-10-CM | POA: Diagnosis not present

## 2016-12-31 DIAGNOSIS — C9110 Chronic lymphocytic leukemia of B-cell type not having achieved remission: Secondary | ICD-10-CM

## 2016-12-31 LAB — CBC WITH DIFFERENTIAL/PLATELET
BASO%: 0.4 % (ref 0.0–2.0)
Basophils Absolute: 0 10*3/uL (ref 0.0–0.1)
EOS%: 7.9 % — ABNORMAL HIGH (ref 0.0–7.0)
Eosinophils Absolute: 0.2 10*3/uL (ref 0.0–0.5)
HEMATOCRIT: 20.9 % — AB (ref 34.8–46.6)
HEMOGLOBIN: 6.9 g/dL — AB (ref 11.6–15.9)
LYMPH#: 1 10*3/uL (ref 0.9–3.3)
LYMPH%: 40.1 % (ref 14.0–49.7)
MCH: 32.9 pg (ref 25.1–34.0)
MCHC: 33 g/dL (ref 31.5–36.0)
MCV: 99.5 fL (ref 79.5–101.0)
MONO#: 0.1 10*3/uL (ref 0.1–0.9)
MONO%: 5.6 % (ref 0.0–14.0)
NEUT%: 46 % (ref 38.4–76.8)
NEUTROS ABS: 1.2 10*3/uL — AB (ref 1.5–6.5)
NRBC: 0 % (ref 0–0)
Platelets: 43 10*3/uL — ABNORMAL LOW (ref 145–400)
RBC: 2.1 10*6/uL — ABNORMAL LOW (ref 3.70–5.45)
RDW: 23.2 % — AB (ref 11.2–14.5)
WBC: 2.5 10*3/uL — AB (ref 3.9–10.3)

## 2016-12-31 LAB — TECHNOLOGIST REVIEW: Technologist Review: NONE SEEN

## 2016-12-31 LAB — PREPARE RBC (CROSSMATCH)

## 2016-12-31 MED ORDER — SODIUM CHLORIDE 0.9 % IV SOLN
250.0000 mL | Freq: Once | INTRAVENOUS | Status: AC
Start: 2016-12-31 — End: 2016-12-31
  Administered 2016-12-31: 250 mL via INTRAVENOUS

## 2016-12-31 MED ORDER — ACETAMINOPHEN 325 MG PO TABS
650.0000 mg | ORAL_TABLET | Freq: Once | ORAL | Status: AC
  Administered 2016-12-31: 650 mg via ORAL
  Filled 2016-12-31: qty 2

## 2016-12-31 MED ORDER — HEPARIN SOD (PORK) LOCK FLUSH 100 UNIT/ML IV SOLN: 250.0000 [IU] | INTRAVENOUS | Status: DC | PRN

## 2016-12-31 MED ORDER — SODIUM CHLORIDE 0.9 % IV SOLN: 250.0000 mL | Freq: Once | INTRAVENOUS | Status: DC

## 2016-12-31 MED ORDER — SODIUM CHLORIDE 0.9% FLUSH
10.0000 mL | INTRAVENOUS | Status: AC | PRN
Start: 2016-12-31 — End: 2016-12-31
  Administered 2016-12-31: 10 mL

## 2016-12-31 MED ORDER — DIPHENHYDRAMINE HCL 25 MG PO CAPS
25.0000 mg | ORAL_CAPSULE | Freq: Once | ORAL | Status: AC
  Administered 2016-12-31: 25 mg via ORAL
  Filled 2016-12-31: qty 1

## 2016-12-31 MED ORDER — SODIUM CHLORIDE 0.9% FLUSH: 3.0000 mL | INTRAVENOUS | Status: DC | PRN

## 2016-12-31 MED ORDER — HEPARIN SOD (PORK) LOCK FLUSH 100 UNIT/ML IV SOLN
500.0000 [IU] | Freq: Every day | INTRAVENOUS | Status: AC | PRN
  Administered 2016-12-31: 500 [IU]
  Filled 2016-12-31: qty 5

## 2016-12-31 NOTE — Progress Notes (Signed)
Patient at [facility] for blood transfusion.  Per patient she took 1000mg  of tylenol this morning around 0700.  Call Dr. Alvy Bimler nurse Hassan Rowan to advise and clarify current order for tylenol.  She will discuss with provider and give me a call back.

## 2016-12-31 NOTE — Progress Notes (Signed)
Per Hassan Rowan at Dr. Calton Dach office.  Give benadryl prior to first unit, give the tylenol prior the start of the second unit.

## 2016-12-31 NOTE — Discharge Instructions (Signed)
Platelet Transfusion A platelet transfusion is a procedure in which you receive donated platelets through an IV tube. Platelets are tiny pieces of blood cells. When a blood vessel is damaged, platelets collect in the damaged area to help form a blood clot. This begins the healing process. If your platelet count gets too low, your blood may have trouble clotting. You may need a platelet transfusion if you have a condition that causes a low number of platelets (thrombocytopenia). A platelet transfusion may be used to stop or prevent bleeding. Tell a health care provider about:  Any allergies you have.  All medicines you are taking, including vitamins, herbs, eye drops, creams, and over-the-counter medicines.  Any problems you or family members have had with anesthetic medicines.  Any blood disorders you have.  Any surgeries you have had.  Any medical conditions you have.  Any reactions you have had during a previous transfusion. What are the risks? Generally, this is a safe procedure. However, problems may occur, including:  Fever with or without chills. The fever usually occurs within the first 4 hours of the transfusion and returns to normal within 48 hours.  Allergic reaction. The reaction is most commonly caused by antibodies your body creates against substances in the transfusion. Signs of an allergic reaction may include itching, hives, difficulty breathing, shock, or low blood pressure.  Sudden (acute) or delayed hemolytic reaction. This rare reaction can occur during the transfusion and up to 28 days after the transfusion. The reaction usually occurs when your bodys defense system (immune system) attacks the new platelets. Signs of a hemolytic reaction may include fever, headache, difficulty breathing, low blood pressure, a rapid heartbeat, or pain in your back, abdomen, chest, or IV site.  Transfusion-related acute lung injury (TRALI). TRALI can occur within hours of a transfusion,  or several days later. This is a rare reaction that causes lung damage. The cause is not known.  Infection. Signs of this rare complication may include fever, chills, vomiting, a rapid heartbeat, or low blood pressure. What happens before the procedure?  You may have a blood test to determine your blood type. This is necessary to find out what kind ofplatelets best matches your platelets.  If you have had an allergic reaction to a transfusion in the past, you may be given medicine to help prevent a reaction. Take this medicine only as directed by your health care provider.  Your temperature, blood pressure, and pulse will be monitored before the transfusion. What happens during the procedure?  An IV will be started in your hand or arm.  The transfusion will be attached to your IV tubing. The bag of donated platelets will be attached to your IV tube andgiven into your vein.  Your temperature, blood pressure, and pulse will be monitored regularly during the transfusion. This monitoring is done to help detect early signs of a transfusion reaction.  If you have any signs or symptoms of a reaction, your transfusion will be stopped and you may be given medicine.  When your transfusion is complete, your IV will be removed.  Pressure may be applied to the IV site for a few minutes.  A bandage (dressing) will be applied. The procedure may vary among health care providers and hospitals. What happens after the procedure?  Your blood pressure, temperature, and pulse will be monitored regularly. This information is not intended to replace advice given to you by your health care provider. Make sure you discuss any questions you have with  your health care provider. Document Released: 08/02/2007 Document Revised: 03/12/2016 Document Reviewed: 08/15/2014 Elsevier Interactive Patient Education  2017 [employer]. Blood Transfusion, Care After This sheet gives you information about how to care for  yourself after your procedure. Your doctor may also give you more specific instructions. If you have problems or questions, contact your doctor. Follow these instructions at home:  Take over-the-counter and prescription medicines only as told by your doctor.  Go back to your normal activities as told by your doctor.  Follow instructions from your doctor about how to take care of the area where an IV tube was put into your vein (insertion site). Make sure you:  Wash your hands with soap and water before you change your bandage (dressing). If there is no soap and water, use hand sanitizer.  Change your bandage as told by your doctor.  Check your IV insertion site every day for signs of infection. Check for:  More redness, swelling, or pain.  More fluid or blood.  Warmth.  Pus or a bad smell. Contact a doctor if:  You have more redness, swelling, or pain around the IV insertion site..  You have more fluid or blood coming from the IV insertion site.  Your IV insertion site feels warm to the touch.  You have pus or a bad smell coming from the IV insertion site.  Your pee (urine) turns pink, red, or brown.  You feel weak after doing your normal activities. Get help right away if:  You have signs of a serious allergic or body defense (immune) system reaction, including:  Itchiness.  Hives.  Trouble breathing.  Anxiety.  Pain in your chest or lower back.  Fever, flushing, and chills.  Fast pulse.  Rash.  Watery poop (diarrhea).  Throwing up (vomiting).  Dark pee.  Serious headache.  Dizziness.  Stiff neck.  Yellow color in your face or the white parts of your eyes (jaundice). Summary  After a blood transfusion, return to your normal activities as told by your doctor.  Every day, check for signs of infection where the IV tube was put into your vein.  Some signs of infection are warm skin, more redness and pain, more fluid or blood, and pus or a bad  smell where the needle went in.  Contact your doctor if you feel weak or have any unusual symptoms. This information is not intended to replace advice given to you by your health care provider. Make sure you discuss any questions you have with your health care provider. Document Released: 10/26/2014 Document Revised: 05/29/2016 Document Reviewed: 05/29/2016 Elsevier Interactive Patient Education  2017 [employer].

## 2016-12-31 NOTE — Progress Notes (Signed)
Diagnosis Association: CLL (chronic lymphocytic leukemia) (HCC) (C91.10)  Provider: Dr. Alvy Bimler  Procedure:  Pt received 2 units of blood and 1 pack of platelets via porta cath.  Pt tolerated procedure well.  Post procedure: Pt was alert, oriented and ambulatory at discharge.

## 2017-01-01 LAB — BPAM RBC
BLOOD PRODUCT EXPIRATION DATE: 201804072359
BLOOD PRODUCT EXPIRATION DATE: 201804072359
ISSUE DATE / TIME: 201803151123
ISSUE DATE / TIME: 201803151123
UNIT TYPE AND RH: 6200
Unit Type and Rh: 6200

## 2017-01-01 LAB — BPAM PLATELET PHERESIS
BLOOD PRODUCT EXPIRATION DATE: 201803172359
ISSUE DATE / TIME: 201803151613
Unit Type and Rh: 6200

## 2017-01-01 LAB — TYPE AND SCREEN
ABO/RH(D): A POS
Antibody Screen: NEGATIVE
UNIT DIVISION: 0
Unit division: 0

## 2017-01-01 LAB — PREPARE PLATELET PHERESIS: UNIT DIVISION: 0

## 2017-02-15 ENCOUNTER — Telehealth: Payer: Self-pay

## 2017-02-15 NOTE — Telephone Encounter (Signed)
s/w Autumn [name] per Dr Alvy Bimler response.

## 2017-02-15 NOTE — Telephone Encounter (Signed)
Autumn [name],  I have not seen this patient for a while and I am not aware she is now on hospice. I have not arranged any follow-up or transfusion support. Please let hospice service aware I am not in the loop and unsure what is going on, she may need to contact [facility] directly

## 2017-02-15 NOTE — Telephone Encounter (Signed)
Autumn [name] from hospice called requesting a call back.  In talking with pt the pt stated her biggest fear is losing her airway. Autumn [name] was asking if we ever consider using  an airway stent. This RN discussed with Autumn [name] that pt was also seeing Autumn [name] at [facility] and [facility] is giving her blood transfusion support. Does Autumn Alvy Bimler have any input to this question. Autumn [name] has not discussed this with the pt.  Autumn [name] with also talk with Autumn [name]. Autumn [name].

## 2017-03-19 DEATH — deceased

## 2017-07-30 ENCOUNTER — Other Ambulatory Visit: Payer: Self-pay | Admitting: Nurse Practitioner
